# Patient Record
Sex: Female | Born: 1935
Health system: Southern US, Community
[De-identification: ages and names within clinical notes are randomized; demographics above are authoritative.]

## PROBLEM LIST (undated history)

## (undated) DIAGNOSIS — I2699 Other pulmonary embolism without acute cor pulmonale: Secondary | ICD-10-CM

## (undated) DIAGNOSIS — R319 Hematuria, unspecified: Secondary | ICD-10-CM

## (undated) DIAGNOSIS — E785 Hyperlipidemia, unspecified: Secondary | ICD-10-CM

## (undated) DIAGNOSIS — C189 Malignant neoplasm of colon, unspecified: Secondary | ICD-10-CM

## (undated) DIAGNOSIS — K8020 Calculus of gallbladder without cholecystitis without obstruction: Secondary | ICD-10-CM

## (undated) DIAGNOSIS — N281 Cyst of kidney, acquired: Secondary | ICD-10-CM

## (undated) DIAGNOSIS — J8410 Pulmonary fibrosis, unspecified: Secondary | ICD-10-CM

## (undated) DIAGNOSIS — M47816 Spondylosis without myelopathy or radiculopathy, lumbar region: Secondary | ICD-10-CM

## (undated) DIAGNOSIS — I639 Cerebral infarction, unspecified: Secondary | ICD-10-CM

## (undated) HISTORY — PX: COLON SURGERY: SHX602

## (undated) HISTORY — PX: OTHER SURGICAL HISTORY: SHX169

---

## 2006-02-08 ENCOUNTER — Ambulatory Visit: Payer: Self-pay | Admitting: Oncology

## 2006-06-04 ENCOUNTER — Ambulatory Visit: Payer: Self-pay | Admitting: Oncology

## 2006-09-24 ENCOUNTER — Ambulatory Visit: Payer: Self-pay | Admitting: Oncology

## 2007-01-14 ENCOUNTER — Ambulatory Visit: Payer: Self-pay | Admitting: Oncology

## 2007-05-13 ENCOUNTER — Ambulatory Visit: Payer: Self-pay | Admitting: Oncology

## 2016-09-17 DIAGNOSIS — I639 Cerebral infarction, unspecified: Secondary | ICD-10-CM

## 2016-09-17 HISTORY — DX: Cerebral infarction, unspecified: I63.9

## 2018-08-18 DIAGNOSIS — M160 Bilateral primary osteoarthritis of hip: Secondary | ICD-10-CM | POA: Diagnosis not present

## 2018-08-18 DIAGNOSIS — E782 Mixed hyperlipidemia: Secondary | ICD-10-CM | POA: Diagnosis not present

## 2018-08-18 DIAGNOSIS — I10 Essential (primary) hypertension: Secondary | ICD-10-CM | POA: Diagnosis not present

## 2018-09-08 DIAGNOSIS — D72829 Elevated white blood cell count, unspecified: Secondary | ICD-10-CM | POA: Diagnosis not present

## 2018-09-08 DIAGNOSIS — E876 Hypokalemia: Secondary | ICD-10-CM | POA: Diagnosis not present

## 2018-09-08 DIAGNOSIS — I2699 Other pulmonary embolism without acute cor pulmonale: Secondary | ICD-10-CM

## 2018-09-08 DIAGNOSIS — R0781 Pleurodynia: Secondary | ICD-10-CM | POA: Diagnosis not present

## 2018-09-08 DIAGNOSIS — K8020 Calculus of gallbladder without cholecystitis without obstruction: Secondary | ICD-10-CM | POA: Diagnosis not present

## 2018-09-08 DIAGNOSIS — R8290 Unspecified abnormal findings in urine: Secondary | ICD-10-CM | POA: Diagnosis not present

## 2018-09-08 DIAGNOSIS — R109 Unspecified abdominal pain: Secondary | ICD-10-CM | POA: Diagnosis not present

## 2018-09-08 DIAGNOSIS — Z79899 Other long term (current) drug therapy: Secondary | ICD-10-CM | POA: Diagnosis not present

## 2018-09-08 DIAGNOSIS — I10 Essential (primary) hypertension: Secondary | ICD-10-CM | POA: Diagnosis not present

## 2018-09-08 DIAGNOSIS — Z792 Long term (current) use of antibiotics: Secondary | ICD-10-CM | POA: Diagnosis not present

## 2018-09-08 DIAGNOSIS — Z7901 Long term (current) use of anticoagulants: Secondary | ICD-10-CM | POA: Diagnosis not present

## 2018-09-09 DIAGNOSIS — I340 Nonrheumatic mitral (valve) insufficiency: Secondary | ICD-10-CM

## 2018-09-09 DIAGNOSIS — I350 Nonrheumatic aortic (valve) stenosis: Secondary | ICD-10-CM

## 2018-09-13 DIAGNOSIS — E785 Hyperlipidemia, unspecified: Secondary | ICD-10-CM | POA: Diagnosis not present

## 2018-09-13 DIAGNOSIS — N390 Urinary tract infection, site not specified: Secondary | ICD-10-CM | POA: Diagnosis not present

## 2018-09-13 DIAGNOSIS — Z823 Family history of stroke: Secondary | ICD-10-CM | POA: Diagnosis not present

## 2018-09-13 DIAGNOSIS — M1990 Unspecified osteoarthritis, unspecified site: Secondary | ICD-10-CM | POA: Diagnosis not present

## 2018-09-13 DIAGNOSIS — I2699 Other pulmonary embolism without acute cor pulmonale: Secondary | ICD-10-CM

## 2018-09-13 DIAGNOSIS — I10 Essential (primary) hypertension: Secondary | ICD-10-CM | POA: Diagnosis not present

## 2018-09-13 DIAGNOSIS — R5381 Other malaise: Secondary | ICD-10-CM | POA: Diagnosis not present

## 2018-09-13 DIAGNOSIS — R0602 Shortness of breath: Secondary | ICD-10-CM | POA: Diagnosis not present

## 2018-09-13 DIAGNOSIS — J9601 Acute respiratory failure with hypoxia: Secondary | ICD-10-CM | POA: Diagnosis not present

## 2018-09-13 DIAGNOSIS — Z7901 Long term (current) use of anticoagulants: Secondary | ICD-10-CM | POA: Diagnosis not present

## 2018-09-13 DIAGNOSIS — Z85038 Personal history of other malignant neoplasm of large intestine: Secondary | ICD-10-CM | POA: Diagnosis not present

## 2018-09-13 DIAGNOSIS — Z8673 Personal history of transient ischemic attack (TIA), and cerebral infarction without residual deficits: Secondary | ICD-10-CM | POA: Diagnosis not present

## 2018-09-13 DIAGNOSIS — Z87891 Personal history of nicotine dependence: Secondary | ICD-10-CM | POA: Diagnosis not present

## 2018-09-13 DIAGNOSIS — Z86711 Personal history of pulmonary embolism: Secondary | ICD-10-CM | POA: Diagnosis not present

## 2018-09-13 DIAGNOSIS — R0902 Hypoxemia: Secondary | ICD-10-CM | POA: Diagnosis not present

## 2018-09-13 DIAGNOSIS — Z8719 Personal history of other diseases of the digestive system: Secondary | ICD-10-CM | POA: Diagnosis not present

## 2018-09-13 DIAGNOSIS — M79672 Pain in left foot: Secondary | ICD-10-CM | POA: Diagnosis not present

## 2018-09-13 DIAGNOSIS — I82811 Embolism and thrombosis of superficial veins of right lower extremities: Secondary | ICD-10-CM | POA: Diagnosis not present

## 2018-09-13 HISTORY — DX: Other pulmonary embolism without acute cor pulmonale: I26.99

## 2018-09-14 DIAGNOSIS — I083 Combined rheumatic disorders of mitral, aortic and tricuspid valves: Secondary | ICD-10-CM | POA: Diagnosis not present

## 2018-09-14 DIAGNOSIS — J9601 Acute respiratory failure with hypoxia: Secondary | ICD-10-CM | POA: Diagnosis not present

## 2018-09-14 DIAGNOSIS — I2699 Other pulmonary embolism without acute cor pulmonale: Secondary | ICD-10-CM | POA: Diagnosis not present

## 2018-09-14 DIAGNOSIS — R5381 Other malaise: Secondary | ICD-10-CM | POA: Diagnosis not present

## 2018-09-15 DIAGNOSIS — N3000 Acute cystitis without hematuria: Secondary | ICD-10-CM | POA: Diagnosis not present

## 2018-09-15 DIAGNOSIS — J9601 Acute respiratory failure with hypoxia: Secondary | ICD-10-CM | POA: Diagnosis not present

## 2018-09-15 DIAGNOSIS — R5381 Other malaise: Secondary | ICD-10-CM | POA: Diagnosis not present

## 2018-09-15 DIAGNOSIS — I2699 Other pulmonary embolism without acute cor pulmonale: Secondary | ICD-10-CM | POA: Diagnosis not present

## 2018-09-16 DIAGNOSIS — N3000 Acute cystitis without hematuria: Secondary | ICD-10-CM | POA: Diagnosis not present

## 2018-09-16 DIAGNOSIS — J9601 Acute respiratory failure with hypoxia: Secondary | ICD-10-CM | POA: Diagnosis not present

## 2018-09-16 DIAGNOSIS — R5381 Other malaise: Secondary | ICD-10-CM | POA: Diagnosis not present

## 2018-09-16 DIAGNOSIS — I2699 Other pulmonary embolism without acute cor pulmonale: Secondary | ICD-10-CM | POA: Diagnosis not present

## 2018-09-17 DIAGNOSIS — N390 Urinary tract infection, site not specified: Secondary | ICD-10-CM | POA: Diagnosis not present

## 2018-09-17 DIAGNOSIS — E785 Hyperlipidemia, unspecified: Secondary | ICD-10-CM | POA: Diagnosis not present

## 2018-09-17 DIAGNOSIS — R5381 Other malaise: Secondary | ICD-10-CM | POA: Diagnosis not present

## 2018-09-17 DIAGNOSIS — Z87891 Personal history of nicotine dependence: Secondary | ICD-10-CM | POA: Diagnosis not present

## 2018-09-17 DIAGNOSIS — I2699 Other pulmonary embolism without acute cor pulmonale: Secondary | ICD-10-CM | POA: Diagnosis not present

## 2018-09-17 DIAGNOSIS — I10 Essential (primary) hypertension: Secondary | ICD-10-CM | POA: Diagnosis not present

## 2018-09-17 DIAGNOSIS — N3000 Acute cystitis without hematuria: Secondary | ICD-10-CM | POA: Diagnosis not present

## 2018-09-17 DIAGNOSIS — J9601 Acute respiratory failure with hypoxia: Secondary | ICD-10-CM | POA: Diagnosis not present

## 2018-09-17 DIAGNOSIS — Z8719 Personal history of other diseases of the digestive system: Secondary | ICD-10-CM | POA: Diagnosis not present

## 2018-09-17 DIAGNOSIS — Z86711 Personal history of pulmonary embolism: Secondary | ICD-10-CM | POA: Diagnosis not present

## 2018-09-17 DIAGNOSIS — Z823 Family history of stroke: Secondary | ICD-10-CM | POA: Diagnosis not present

## 2018-09-17 DIAGNOSIS — M79672 Pain in left foot: Secondary | ICD-10-CM | POA: Diagnosis not present

## 2018-09-17 DIAGNOSIS — Z85038 Personal history of other malignant neoplasm of large intestine: Secondary | ICD-10-CM | POA: Diagnosis not present

## 2018-09-17 DIAGNOSIS — R600 Localized edema: Secondary | ICD-10-CM | POA: Diagnosis not present

## 2018-09-17 DIAGNOSIS — M1990 Unspecified osteoarthritis, unspecified site: Secondary | ICD-10-CM | POA: Diagnosis not present

## 2018-09-17 DIAGNOSIS — Z7901 Long term (current) use of anticoagulants: Secondary | ICD-10-CM | POA: Diagnosis not present

## 2018-09-17 DIAGNOSIS — Z8673 Personal history of transient ischemic attack (TIA), and cerebral infarction without residual deficits: Secondary | ICD-10-CM | POA: Diagnosis not present

## 2018-09-17 DIAGNOSIS — M19072 Primary osteoarthritis, left ankle and foot: Secondary | ICD-10-CM | POA: Diagnosis not present

## 2018-09-18 DIAGNOSIS — I482 Chronic atrial fibrillation, unspecified: Secondary | ICD-10-CM | POA: Diagnosis not present

## 2018-09-18 DIAGNOSIS — Z7901 Long term (current) use of anticoagulants: Secondary | ICD-10-CM | POA: Diagnosis not present

## 2018-09-18 DIAGNOSIS — Z8673 Personal history of transient ischemic attack (TIA), and cerebral infarction without residual deficits: Secondary | ICD-10-CM | POA: Diagnosis not present

## 2018-09-18 DIAGNOSIS — J9601 Acute respiratory failure with hypoxia: Secondary | ICD-10-CM | POA: Diagnosis not present

## 2018-09-18 DIAGNOSIS — M25572 Pain in left ankle and joints of left foot: Secondary | ICD-10-CM | POA: Diagnosis not present

## 2018-09-18 DIAGNOSIS — Z79899 Other long term (current) drug therapy: Secondary | ICD-10-CM | POA: Diagnosis not present

## 2018-09-18 DIAGNOSIS — E785 Hyperlipidemia, unspecified: Secondary | ICD-10-CM | POA: Diagnosis not present

## 2018-09-18 DIAGNOSIS — M79671 Pain in right foot: Secondary | ICD-10-CM | POA: Diagnosis not present

## 2018-09-18 DIAGNOSIS — R509 Fever, unspecified: Secondary | ICD-10-CM | POA: Diagnosis not present

## 2018-09-18 DIAGNOSIS — M79605 Pain in left leg: Secondary | ICD-10-CM | POA: Diagnosis not present

## 2018-09-18 DIAGNOSIS — R05 Cough: Secondary | ICD-10-CM | POA: Diagnosis not present

## 2018-09-18 DIAGNOSIS — M79672 Pain in left foot: Secondary | ICD-10-CM | POA: Diagnosis not present

## 2018-09-18 DIAGNOSIS — Z9189 Other specified personal risk factors, not elsewhere classified: Secondary | ICD-10-CM | POA: Diagnosis not present

## 2018-09-18 DIAGNOSIS — Z888 Allergy status to other drugs, medicaments and biological substances status: Secondary | ICD-10-CM | POA: Diagnosis not present

## 2018-09-18 DIAGNOSIS — R5381 Other malaise: Secondary | ICD-10-CM | POA: Diagnosis not present

## 2018-09-18 DIAGNOSIS — I2699 Other pulmonary embolism without acute cor pulmonale: Secondary | ICD-10-CM | POA: Diagnosis not present

## 2018-09-18 DIAGNOSIS — Z85038 Personal history of other malignant neoplasm of large intestine: Secondary | ICD-10-CM | POA: Diagnosis not present

## 2018-09-18 DIAGNOSIS — M1991 Primary osteoarthritis, unspecified site: Secondary | ICD-10-CM | POA: Diagnosis not present

## 2018-09-18 DIAGNOSIS — Z8719 Personal history of other diseases of the digestive system: Secondary | ICD-10-CM | POA: Diagnosis not present

## 2018-09-18 DIAGNOSIS — Z9981 Dependence on supplemental oxygen: Secondary | ICD-10-CM | POA: Diagnosis not present

## 2018-09-18 DIAGNOSIS — Z87891 Personal history of nicotine dependence: Secondary | ICD-10-CM | POA: Diagnosis not present

## 2018-09-18 DIAGNOSIS — N390 Urinary tract infection, site not specified: Secondary | ICD-10-CM | POA: Diagnosis not present

## 2018-09-18 DIAGNOSIS — R0902 Hypoxemia: Secondary | ICD-10-CM | POA: Diagnosis not present

## 2018-09-18 DIAGNOSIS — R319 Hematuria, unspecified: Secondary | ICD-10-CM | POA: Diagnosis not present

## 2018-09-18 DIAGNOSIS — Z86711 Personal history of pulmonary embolism: Secondary | ICD-10-CM | POA: Diagnosis not present

## 2018-09-18 DIAGNOSIS — I10 Essential (primary) hypertension: Secondary | ICD-10-CM | POA: Diagnosis not present

## 2018-09-18 DIAGNOSIS — N3000 Acute cystitis without hematuria: Secondary | ICD-10-CM | POA: Diagnosis not present

## 2018-09-18 DIAGNOSIS — M19072 Primary osteoarthritis, left ankle and foot: Secondary | ICD-10-CM | POA: Diagnosis not present

## 2018-09-18 DIAGNOSIS — M7989 Other specified soft tissue disorders: Secondary | ICD-10-CM | POA: Diagnosis not present

## 2018-09-18 DIAGNOSIS — M7732 Calcaneal spur, left foot: Secondary | ICD-10-CM | POA: Diagnosis not present

## 2018-09-18 DIAGNOSIS — M25472 Effusion, left ankle: Secondary | ICD-10-CM | POA: Diagnosis not present

## 2018-09-18 DIAGNOSIS — Z823 Family history of stroke: Secondary | ICD-10-CM | POA: Diagnosis not present

## 2018-09-18 DIAGNOSIS — M1990 Unspecified osteoarthritis, unspecified site: Secondary | ICD-10-CM | POA: Diagnosis not present

## 2018-09-18 DIAGNOSIS — I824Y1 Acute embolism and thrombosis of unspecified deep veins of right proximal lower extremity: Secondary | ICD-10-CM | POA: Diagnosis not present

## 2018-09-23 DIAGNOSIS — Z888 Allergy status to other drugs, medicaments and biological substances status: Secondary | ICD-10-CM | POA: Diagnosis not present

## 2018-09-23 DIAGNOSIS — M79605 Pain in left leg: Secondary | ICD-10-CM | POA: Diagnosis not present

## 2018-09-23 DIAGNOSIS — Z8673 Personal history of transient ischemic attack (TIA), and cerebral infarction without residual deficits: Secondary | ICD-10-CM | POA: Diagnosis not present

## 2018-09-23 DIAGNOSIS — Z86711 Personal history of pulmonary embolism: Secondary | ICD-10-CM | POA: Diagnosis not present

## 2018-09-23 DIAGNOSIS — I482 Chronic atrial fibrillation, unspecified: Secondary | ICD-10-CM | POA: Diagnosis not present

## 2018-09-23 DIAGNOSIS — M25572 Pain in left ankle and joints of left foot: Secondary | ICD-10-CM | POA: Diagnosis not present

## 2018-09-23 DIAGNOSIS — M25472 Effusion, left ankle: Secondary | ICD-10-CM | POA: Diagnosis not present

## 2018-09-23 DIAGNOSIS — M1991 Primary osteoarthritis, unspecified site: Secondary | ICD-10-CM | POA: Diagnosis not present

## 2018-09-23 DIAGNOSIS — Z7901 Long term (current) use of anticoagulants: Secondary | ICD-10-CM | POA: Diagnosis not present

## 2018-09-23 DIAGNOSIS — M79672 Pain in left foot: Secondary | ICD-10-CM | POA: Diagnosis not present

## 2018-09-23 DIAGNOSIS — M7989 Other specified soft tissue disorders: Secondary | ICD-10-CM | POA: Diagnosis not present

## 2018-09-23 DIAGNOSIS — Z79899 Other long term (current) drug therapy: Secondary | ICD-10-CM | POA: Diagnosis not present

## 2018-09-23 DIAGNOSIS — M7732 Calcaneal spur, left foot: Secondary | ICD-10-CM | POA: Diagnosis not present

## 2018-09-23 DIAGNOSIS — I10 Essential (primary) hypertension: Secondary | ICD-10-CM | POA: Diagnosis not present

## 2018-09-23 DIAGNOSIS — E785 Hyperlipidemia, unspecified: Secondary | ICD-10-CM | POA: Diagnosis not present

## 2018-09-23 DIAGNOSIS — M19072 Primary osteoarthritis, left ankle and foot: Secondary | ICD-10-CM | POA: Diagnosis not present

## 2018-09-25 DIAGNOSIS — J9601 Acute respiratory failure with hypoxia: Secondary | ICD-10-CM | POA: Diagnosis not present

## 2018-09-25 DIAGNOSIS — R5381 Other malaise: Secondary | ICD-10-CM | POA: Diagnosis not present

## 2018-09-25 DIAGNOSIS — I824Y1 Acute embolism and thrombosis of unspecified deep veins of right proximal lower extremity: Secondary | ICD-10-CM | POA: Diagnosis not present

## 2018-09-25 DIAGNOSIS — I2699 Other pulmonary embolism without acute cor pulmonale: Secondary | ICD-10-CM | POA: Diagnosis not present

## 2018-09-26 DIAGNOSIS — M1990 Unspecified osteoarthritis, unspecified site: Secondary | ICD-10-CM | POA: Diagnosis not present

## 2018-09-26 DIAGNOSIS — I482 Chronic atrial fibrillation, unspecified: Secondary | ICD-10-CM | POA: Diagnosis not present

## 2018-09-26 DIAGNOSIS — I10 Essential (primary) hypertension: Secondary | ICD-10-CM | POA: Diagnosis not present

## 2018-09-26 DIAGNOSIS — Z7901 Long term (current) use of anticoagulants: Secondary | ICD-10-CM | POA: Diagnosis not present

## 2018-09-29 DIAGNOSIS — Z7901 Long term (current) use of anticoagulants: Secondary | ICD-10-CM | POA: Diagnosis not present

## 2018-09-29 DIAGNOSIS — R319 Hematuria, unspecified: Secondary | ICD-10-CM | POA: Diagnosis not present

## 2018-09-29 DIAGNOSIS — I2699 Other pulmonary embolism without acute cor pulmonale: Secondary | ICD-10-CM | POA: Diagnosis not present

## 2018-09-29 DIAGNOSIS — I482 Chronic atrial fibrillation, unspecified: Secondary | ICD-10-CM | POA: Diagnosis not present

## 2018-09-30 DIAGNOSIS — M79671 Pain in right foot: Secondary | ICD-10-CM | POA: Diagnosis not present

## 2018-09-30 DIAGNOSIS — I482 Chronic atrial fibrillation, unspecified: Secondary | ICD-10-CM | POA: Diagnosis not present

## 2018-09-30 DIAGNOSIS — I10 Essential (primary) hypertension: Secondary | ICD-10-CM | POA: Diagnosis not present

## 2018-09-30 DIAGNOSIS — R319 Hematuria, unspecified: Secondary | ICD-10-CM | POA: Diagnosis not present

## 2018-10-01 DIAGNOSIS — Z7901 Long term (current) use of anticoagulants: Secondary | ICD-10-CM | POA: Diagnosis not present

## 2018-10-01 DIAGNOSIS — I482 Chronic atrial fibrillation, unspecified: Secondary | ICD-10-CM | POA: Diagnosis not present

## 2018-10-01 DIAGNOSIS — Z9189 Other specified personal risk factors, not elsewhere classified: Secondary | ICD-10-CM | POA: Diagnosis not present

## 2018-10-01 DIAGNOSIS — R319 Hematuria, unspecified: Secondary | ICD-10-CM | POA: Diagnosis not present

## 2018-10-03 DIAGNOSIS — I824Y1 Acute embolism and thrombosis of unspecified deep veins of right proximal lower extremity: Secondary | ICD-10-CM | POA: Diagnosis not present

## 2018-10-03 DIAGNOSIS — I10 Essential (primary) hypertension: Secondary | ICD-10-CM | POA: Diagnosis not present

## 2018-10-03 DIAGNOSIS — I482 Chronic atrial fibrillation, unspecified: Secondary | ICD-10-CM | POA: Diagnosis not present

## 2018-10-03 DIAGNOSIS — Z7901 Long term (current) use of anticoagulants: Secondary | ICD-10-CM | POA: Diagnosis not present

## 2018-10-10 DIAGNOSIS — Z87891 Personal history of nicotine dependence: Secondary | ICD-10-CM | POA: Diagnosis not present

## 2018-10-10 DIAGNOSIS — Z79899 Other long term (current) drug therapy: Secondary | ICD-10-CM | POA: Diagnosis not present

## 2018-10-10 DIAGNOSIS — Z888 Allergy status to other drugs, medicaments and biological substances status: Secondary | ICD-10-CM | POA: Diagnosis not present

## 2018-10-10 DIAGNOSIS — E785 Hyperlipidemia, unspecified: Secondary | ICD-10-CM | POA: Diagnosis not present

## 2018-10-10 DIAGNOSIS — M1991 Primary osteoarthritis, unspecified site: Secondary | ICD-10-CM | POA: Diagnosis not present

## 2018-10-10 DIAGNOSIS — Z8673 Personal history of transient ischemic attack (TIA), and cerebral infarction without residual deficits: Secondary | ICD-10-CM | POA: Diagnosis not present

## 2018-10-10 DIAGNOSIS — Z9181 History of falling: Secondary | ICD-10-CM | POA: Diagnosis not present

## 2018-10-10 DIAGNOSIS — I10 Essential (primary) hypertension: Secondary | ICD-10-CM | POA: Diagnosis not present

## 2018-10-10 DIAGNOSIS — Z7901 Long term (current) use of anticoagulants: Secondary | ICD-10-CM | POA: Diagnosis not present

## 2018-10-10 DIAGNOSIS — R05 Cough: Secondary | ICD-10-CM | POA: Diagnosis not present

## 2018-10-10 DIAGNOSIS — Z87440 Personal history of urinary (tract) infections: Secondary | ICD-10-CM | POA: Diagnosis not present

## 2018-10-10 DIAGNOSIS — Z85038 Personal history of other malignant neoplasm of large intestine: Secondary | ICD-10-CM | POA: Diagnosis not present

## 2018-10-10 DIAGNOSIS — R0689 Other abnormalities of breathing: Secondary | ICD-10-CM | POA: Diagnosis not present

## 2018-10-10 DIAGNOSIS — I2699 Other pulmonary embolism without acute cor pulmonale: Secondary | ICD-10-CM | POA: Diagnosis not present

## 2018-10-10 DIAGNOSIS — R0600 Dyspnea, unspecified: Secondary | ICD-10-CM | POA: Diagnosis not present

## 2018-10-10 DIAGNOSIS — R509 Fever, unspecified: Secondary | ICD-10-CM | POA: Diagnosis not present

## 2018-10-10 DIAGNOSIS — J449 Chronic obstructive pulmonary disease, unspecified: Secondary | ICD-10-CM | POA: Diagnosis not present

## 2018-10-10 DIAGNOSIS — J069 Acute upper respiratory infection, unspecified: Secondary | ICD-10-CM | POA: Diagnosis not present

## 2018-10-10 DIAGNOSIS — Z86711 Personal history of pulmonary embolism: Secondary | ICD-10-CM | POA: Diagnosis not present

## 2018-10-10 DIAGNOSIS — J849 Interstitial pulmonary disease, unspecified: Secondary | ICD-10-CM | POA: Diagnosis not present

## 2018-10-13 DIAGNOSIS — Z87891 Personal history of nicotine dependence: Secondary | ICD-10-CM | POA: Diagnosis not present

## 2018-10-13 DIAGNOSIS — Z85038 Personal history of other malignant neoplasm of large intestine: Secondary | ICD-10-CM | POA: Diagnosis not present

## 2018-10-13 DIAGNOSIS — I2699 Other pulmonary embolism without acute cor pulmonale: Secondary | ICD-10-CM | POA: Diagnosis not present

## 2018-10-13 DIAGNOSIS — Z87440 Personal history of urinary (tract) infections: Secondary | ICD-10-CM | POA: Diagnosis not present

## 2018-10-13 DIAGNOSIS — M1991 Primary osteoarthritis, unspecified site: Secondary | ICD-10-CM | POA: Diagnosis not present

## 2018-10-13 DIAGNOSIS — Z7901 Long term (current) use of anticoagulants: Secondary | ICD-10-CM | POA: Diagnosis not present

## 2018-10-13 DIAGNOSIS — Z8673 Personal history of transient ischemic attack (TIA), and cerebral infarction without residual deficits: Secondary | ICD-10-CM | POA: Diagnosis not present

## 2018-10-13 DIAGNOSIS — Z9181 History of falling: Secondary | ICD-10-CM | POA: Diagnosis not present

## 2018-10-13 DIAGNOSIS — I10 Essential (primary) hypertension: Secondary | ICD-10-CM | POA: Diagnosis not present

## 2018-10-13 DIAGNOSIS — E785 Hyperlipidemia, unspecified: Secondary | ICD-10-CM | POA: Diagnosis not present

## 2018-10-21 DIAGNOSIS — R55 Syncope and collapse: Secondary | ICD-10-CM | POA: Diagnosis not present

## 2018-10-21 DIAGNOSIS — Z8673 Personal history of transient ischemic attack (TIA), and cerebral infarction without residual deficits: Secondary | ICD-10-CM | POA: Diagnosis not present

## 2018-10-21 DIAGNOSIS — E785 Hyperlipidemia, unspecified: Secondary | ICD-10-CM | POA: Diagnosis not present

## 2018-10-21 DIAGNOSIS — Z85038 Personal history of other malignant neoplasm of large intestine: Secondary | ICD-10-CM | POA: Diagnosis not present

## 2018-10-21 DIAGNOSIS — I10 Essential (primary) hypertension: Secondary | ICD-10-CM | POA: Diagnosis not present

## 2018-10-21 DIAGNOSIS — Z9181 History of falling: Secondary | ICD-10-CM | POA: Diagnosis not present

## 2018-10-21 DIAGNOSIS — Z7901 Long term (current) use of anticoagulants: Secondary | ICD-10-CM | POA: Diagnosis not present

## 2018-10-21 DIAGNOSIS — I2699 Other pulmonary embolism without acute cor pulmonale: Secondary | ICD-10-CM | POA: Diagnosis not present

## 2018-10-21 DIAGNOSIS — Z87891 Personal history of nicotine dependence: Secondary | ICD-10-CM | POA: Diagnosis not present

## 2018-10-21 DIAGNOSIS — Z87440 Personal history of urinary (tract) infections: Secondary | ICD-10-CM | POA: Diagnosis not present

## 2018-10-21 DIAGNOSIS — M1991 Primary osteoarthritis, unspecified site: Secondary | ICD-10-CM | POA: Diagnosis not present

## 2018-10-22 DIAGNOSIS — Z87891 Personal history of nicotine dependence: Secondary | ICD-10-CM | POA: Diagnosis not present

## 2018-10-22 DIAGNOSIS — Z8673 Personal history of transient ischemic attack (TIA), and cerebral infarction without residual deficits: Secondary | ICD-10-CM | POA: Diagnosis not present

## 2018-10-22 DIAGNOSIS — E785 Hyperlipidemia, unspecified: Secondary | ICD-10-CM | POA: Diagnosis not present

## 2018-10-22 DIAGNOSIS — I10 Essential (primary) hypertension: Secondary | ICD-10-CM | POA: Diagnosis not present

## 2018-10-22 DIAGNOSIS — Z7901 Long term (current) use of anticoagulants: Secondary | ICD-10-CM | POA: Diagnosis not present

## 2018-10-22 DIAGNOSIS — Z9181 History of falling: Secondary | ICD-10-CM | POA: Diagnosis not present

## 2018-10-22 DIAGNOSIS — Z85038 Personal history of other malignant neoplasm of large intestine: Secondary | ICD-10-CM | POA: Diagnosis not present

## 2018-10-22 DIAGNOSIS — M1991 Primary osteoarthritis, unspecified site: Secondary | ICD-10-CM | POA: Diagnosis not present

## 2018-10-22 DIAGNOSIS — I2699 Other pulmonary embolism without acute cor pulmonale: Secondary | ICD-10-CM | POA: Diagnosis not present

## 2018-10-22 DIAGNOSIS — Z87440 Personal history of urinary (tract) infections: Secondary | ICD-10-CM | POA: Diagnosis not present

## 2018-10-22 DIAGNOSIS — R55 Syncope and collapse: Secondary | ICD-10-CM | POA: Diagnosis not present

## 2018-10-24 DIAGNOSIS — M1991 Primary osteoarthritis, unspecified site: Secondary | ICD-10-CM | POA: Diagnosis not present

## 2018-10-24 DIAGNOSIS — R55 Syncope and collapse: Secondary | ICD-10-CM | POA: Diagnosis not present

## 2018-10-24 DIAGNOSIS — E785 Hyperlipidemia, unspecified: Secondary | ICD-10-CM | POA: Diagnosis not present

## 2018-10-24 DIAGNOSIS — Z87891 Personal history of nicotine dependence: Secondary | ICD-10-CM | POA: Diagnosis not present

## 2018-10-24 DIAGNOSIS — I10 Essential (primary) hypertension: Secondary | ICD-10-CM | POA: Diagnosis not present

## 2018-10-24 DIAGNOSIS — Z85038 Personal history of other malignant neoplasm of large intestine: Secondary | ICD-10-CM | POA: Diagnosis not present

## 2018-10-24 DIAGNOSIS — Z9181 History of falling: Secondary | ICD-10-CM | POA: Diagnosis not present

## 2018-10-24 DIAGNOSIS — Z87440 Personal history of urinary (tract) infections: Secondary | ICD-10-CM | POA: Diagnosis not present

## 2018-10-24 DIAGNOSIS — Z7901 Long term (current) use of anticoagulants: Secondary | ICD-10-CM | POA: Diagnosis not present

## 2018-10-24 DIAGNOSIS — Z8673 Personal history of transient ischemic attack (TIA), and cerebral infarction without residual deficits: Secondary | ICD-10-CM | POA: Diagnosis not present

## 2018-10-24 DIAGNOSIS — I2699 Other pulmonary embolism without acute cor pulmonale: Secondary | ICD-10-CM | POA: Diagnosis not present

## 2018-10-27 DIAGNOSIS — M1991 Primary osteoarthritis, unspecified site: Secondary | ICD-10-CM | POA: Diagnosis not present

## 2018-10-27 DIAGNOSIS — Z85038 Personal history of other malignant neoplasm of large intestine: Secondary | ICD-10-CM | POA: Diagnosis not present

## 2018-10-27 DIAGNOSIS — Z87440 Personal history of urinary (tract) infections: Secondary | ICD-10-CM | POA: Diagnosis not present

## 2018-10-27 DIAGNOSIS — Z7901 Long term (current) use of anticoagulants: Secondary | ICD-10-CM | POA: Diagnosis not present

## 2018-10-27 DIAGNOSIS — Z8673 Personal history of transient ischemic attack (TIA), and cerebral infarction without residual deficits: Secondary | ICD-10-CM | POA: Diagnosis not present

## 2018-10-27 DIAGNOSIS — Z9181 History of falling: Secondary | ICD-10-CM | POA: Diagnosis not present

## 2018-10-27 DIAGNOSIS — I2699 Other pulmonary embolism without acute cor pulmonale: Secondary | ICD-10-CM | POA: Diagnosis not present

## 2018-10-27 DIAGNOSIS — Z87891 Personal history of nicotine dependence: Secondary | ICD-10-CM | POA: Diagnosis not present

## 2018-10-27 DIAGNOSIS — I10 Essential (primary) hypertension: Secondary | ICD-10-CM | POA: Diagnosis not present

## 2018-10-27 DIAGNOSIS — E785 Hyperlipidemia, unspecified: Secondary | ICD-10-CM | POA: Diagnosis not present

## 2018-10-28 DIAGNOSIS — B9689 Other specified bacterial agents as the cause of diseases classified elsewhere: Secondary | ICD-10-CM | POA: Diagnosis not present

## 2018-10-28 DIAGNOSIS — S0011XA Contusion of right eyelid and periocular area, initial encounter: Secondary | ICD-10-CM | POA: Diagnosis not present

## 2018-10-28 DIAGNOSIS — Z9989 Dependence on other enabling machines and devices: Secondary | ICD-10-CM | POA: Diagnosis not present

## 2018-10-28 DIAGNOSIS — I2699 Other pulmonary embolism without acute cor pulmonale: Secondary | ICD-10-CM | POA: Diagnosis not present

## 2018-10-28 DIAGNOSIS — I6523 Occlusion and stenosis of bilateral carotid arteries: Secondary | ICD-10-CM | POA: Diagnosis not present

## 2018-10-28 DIAGNOSIS — Z79899 Other long term (current) drug therapy: Secondary | ICD-10-CM | POA: Diagnosis not present

## 2018-10-28 DIAGNOSIS — Z7901 Long term (current) use of anticoagulants: Secondary | ICD-10-CM | POA: Diagnosis not present

## 2018-10-28 DIAGNOSIS — R531 Weakness: Secondary | ICD-10-CM | POA: Diagnosis not present

## 2018-10-28 DIAGNOSIS — J439 Emphysema, unspecified: Secondary | ICD-10-CM | POA: Diagnosis not present

## 2018-10-28 DIAGNOSIS — I959 Hypotension, unspecified: Secondary | ICD-10-CM | POA: Diagnosis not present

## 2018-10-28 DIAGNOSIS — S0990XA Unspecified injury of head, initial encounter: Secondary | ICD-10-CM | POA: Diagnosis not present

## 2018-10-28 DIAGNOSIS — N390 Urinary tract infection, site not specified: Secondary | ICD-10-CM | POA: Diagnosis not present

## 2018-10-28 DIAGNOSIS — R319 Hematuria, unspecified: Secondary | ICD-10-CM | POA: Diagnosis not present

## 2018-10-28 DIAGNOSIS — M4802 Spinal stenosis, cervical region: Secondary | ICD-10-CM | POA: Diagnosis not present

## 2018-10-28 DIAGNOSIS — R55 Syncope and collapse: Secondary | ICD-10-CM | POA: Diagnosis not present

## 2018-10-28 DIAGNOSIS — S0083XA Contusion of other part of head, initial encounter: Secondary | ICD-10-CM | POA: Diagnosis not present

## 2018-10-28 DIAGNOSIS — S199XXA Unspecified injury of neck, initial encounter: Secondary | ICD-10-CM | POA: Diagnosis not present

## 2018-10-28 DIAGNOSIS — Z7409 Other reduced mobility: Secondary | ICD-10-CM | POA: Diagnosis not present

## 2018-10-28 DIAGNOSIS — R110 Nausea: Secondary | ICD-10-CM | POA: Diagnosis not present

## 2018-10-28 DIAGNOSIS — I440 Atrioventricular block, first degree: Secondary | ICD-10-CM | POA: Diagnosis not present

## 2018-10-28 DIAGNOSIS — Z8673 Personal history of transient ischemic attack (TIA), and cerebral infarction without residual deficits: Secondary | ICD-10-CM | POA: Diagnosis not present

## 2018-10-28 DIAGNOSIS — S129XXA Fracture of neck, unspecified, initial encounter: Secondary | ICD-10-CM | POA: Diagnosis not present

## 2018-10-28 DIAGNOSIS — I951 Orthostatic hypotension: Secondary | ICD-10-CM | POA: Diagnosis not present

## 2018-10-28 DIAGNOSIS — R197 Diarrhea, unspecified: Secondary | ICD-10-CM | POA: Diagnosis not present

## 2018-10-28 DIAGNOSIS — J3489 Other specified disorders of nose and nasal sinuses: Secondary | ICD-10-CM | POA: Diagnosis not present

## 2018-10-28 DIAGNOSIS — S12200A Unspecified displaced fracture of third cervical vertebra, initial encounter for closed fracture: Secondary | ICD-10-CM | POA: Diagnosis not present

## 2018-10-28 DIAGNOSIS — N3000 Acute cystitis without hematuria: Secondary | ICD-10-CM | POA: Diagnosis not present

## 2018-10-28 DIAGNOSIS — Z602 Problems related to living alone: Secondary | ICD-10-CM | POA: Diagnosis not present

## 2018-10-28 DIAGNOSIS — R112 Nausea with vomiting, unspecified: Secondary | ICD-10-CM | POA: Diagnosis not present

## 2018-10-28 DIAGNOSIS — S12290A Other displaced fracture of third cervical vertebra, initial encounter for closed fracture: Secondary | ICD-10-CM | POA: Diagnosis not present

## 2018-10-28 DIAGNOSIS — I10 Essential (primary) hypertension: Secondary | ICD-10-CM | POA: Diagnosis not present

## 2018-10-29 DIAGNOSIS — N3000 Acute cystitis without hematuria: Secondary | ICD-10-CM | POA: Diagnosis not present

## 2018-10-29 DIAGNOSIS — I10 Essential (primary) hypertension: Secondary | ICD-10-CM | POA: Diagnosis not present

## 2018-10-29 DIAGNOSIS — S12200A Unspecified displaced fracture of third cervical vertebra, initial encounter for closed fracture: Secondary | ICD-10-CM | POA: Diagnosis not present

## 2018-10-29 DIAGNOSIS — R55 Syncope and collapse: Secondary | ICD-10-CM | POA: Diagnosis not present

## 2018-10-29 DIAGNOSIS — I2119 ST elevation (STEMI) myocardial infarction involving other coronary artery of inferior wall: Secondary | ICD-10-CM | POA: Diagnosis not present

## 2018-10-29 DIAGNOSIS — I493 Ventricular premature depolarization: Secondary | ICD-10-CM | POA: Diagnosis not present

## 2018-10-29 DIAGNOSIS — I639 Cerebral infarction, unspecified: Secondary | ICD-10-CM | POA: Diagnosis not present

## 2018-10-30 DIAGNOSIS — R55 Syncope and collapse: Secondary | ICD-10-CM | POA: Diagnosis not present

## 2018-10-30 DIAGNOSIS — S12200D Unspecified displaced fracture of third cervical vertebra, subsequent encounter for fracture with routine healing: Secondary | ICD-10-CM | POA: Diagnosis not present

## 2018-10-30 DIAGNOSIS — I2699 Other pulmonary embolism without acute cor pulmonale: Secondary | ICD-10-CM | POA: Diagnosis not present

## 2018-10-30 DIAGNOSIS — R531 Weakness: Secondary | ICD-10-CM | POA: Diagnosis not present

## 2018-10-30 DIAGNOSIS — N3001 Acute cystitis with hematuria: Secondary | ICD-10-CM | POA: Diagnosis not present

## 2018-10-31 DIAGNOSIS — S12200D Unspecified displaced fracture of third cervical vertebra, subsequent encounter for fracture with routine healing: Secondary | ICD-10-CM | POA: Diagnosis not present

## 2018-10-31 DIAGNOSIS — R55 Syncope and collapse: Secondary | ICD-10-CM | POA: Diagnosis not present

## 2018-10-31 DIAGNOSIS — N3001 Acute cystitis with hematuria: Secondary | ICD-10-CM | POA: Diagnosis not present

## 2018-10-31 DIAGNOSIS — I2699 Other pulmonary embolism without acute cor pulmonale: Secondary | ICD-10-CM | POA: Diagnosis not present

## 2018-10-31 DIAGNOSIS — R531 Weakness: Secondary | ICD-10-CM | POA: Diagnosis not present

## 2018-11-03 DIAGNOSIS — Z87440 Personal history of urinary (tract) infections: Secondary | ICD-10-CM | POA: Diagnosis not present

## 2018-11-03 DIAGNOSIS — Z9181 History of falling: Secondary | ICD-10-CM | POA: Diagnosis not present

## 2018-11-03 DIAGNOSIS — M1991 Primary osteoarthritis, unspecified site: Secondary | ICD-10-CM | POA: Diagnosis not present

## 2018-11-03 DIAGNOSIS — Z87891 Personal history of nicotine dependence: Secondary | ICD-10-CM | POA: Diagnosis not present

## 2018-11-03 DIAGNOSIS — Z8673 Personal history of transient ischemic attack (TIA), and cerebral infarction without residual deficits: Secondary | ICD-10-CM | POA: Diagnosis not present

## 2018-11-03 DIAGNOSIS — I10 Essential (primary) hypertension: Secondary | ICD-10-CM | POA: Diagnosis not present

## 2018-11-03 DIAGNOSIS — E785 Hyperlipidemia, unspecified: Secondary | ICD-10-CM | POA: Diagnosis not present

## 2018-11-03 DIAGNOSIS — I2699 Other pulmonary embolism without acute cor pulmonale: Secondary | ICD-10-CM | POA: Diagnosis not present

## 2018-11-03 DIAGNOSIS — Z85038 Personal history of other malignant neoplasm of large intestine: Secondary | ICD-10-CM | POA: Diagnosis not present

## 2018-11-03 DIAGNOSIS — Z7901 Long term (current) use of anticoagulants: Secondary | ICD-10-CM | POA: Diagnosis not present

## 2018-11-04 DIAGNOSIS — I2699 Other pulmonary embolism without acute cor pulmonale: Secondary | ICD-10-CM | POA: Diagnosis not present

## 2018-11-04 DIAGNOSIS — Z87440 Personal history of urinary (tract) infections: Secondary | ICD-10-CM | POA: Diagnosis not present

## 2018-11-04 DIAGNOSIS — Z9181 History of falling: Secondary | ICD-10-CM | POA: Diagnosis not present

## 2018-11-04 DIAGNOSIS — Z87891 Personal history of nicotine dependence: Secondary | ICD-10-CM | POA: Diagnosis not present

## 2018-11-04 DIAGNOSIS — M1991 Primary osteoarthritis, unspecified site: Secondary | ICD-10-CM | POA: Diagnosis not present

## 2018-11-04 DIAGNOSIS — Z8673 Personal history of transient ischemic attack (TIA), and cerebral infarction without residual deficits: Secondary | ICD-10-CM | POA: Diagnosis not present

## 2018-11-04 DIAGNOSIS — Z7901 Long term (current) use of anticoagulants: Secondary | ICD-10-CM | POA: Diagnosis not present

## 2018-11-04 DIAGNOSIS — E785 Hyperlipidemia, unspecified: Secondary | ICD-10-CM | POA: Diagnosis not present

## 2018-11-04 DIAGNOSIS — I10 Essential (primary) hypertension: Secondary | ICD-10-CM | POA: Diagnosis not present

## 2018-11-04 DIAGNOSIS — Z85038 Personal history of other malignant neoplasm of large intestine: Secondary | ICD-10-CM | POA: Diagnosis not present

## 2018-11-05 DIAGNOSIS — I10 Essential (primary) hypertension: Secondary | ICD-10-CM | POA: Diagnosis not present

## 2018-11-05 DIAGNOSIS — M160 Bilateral primary osteoarthritis of hip: Secondary | ICD-10-CM | POA: Diagnosis not present

## 2018-11-05 DIAGNOSIS — E782 Mixed hyperlipidemia: Secondary | ICD-10-CM | POA: Diagnosis not present

## 2018-11-07 DIAGNOSIS — Z87440 Personal history of urinary (tract) infections: Secondary | ICD-10-CM | POA: Diagnosis not present

## 2018-11-07 DIAGNOSIS — E785 Hyperlipidemia, unspecified: Secondary | ICD-10-CM | POA: Diagnosis not present

## 2018-11-07 DIAGNOSIS — M1991 Primary osteoarthritis, unspecified site: Secondary | ICD-10-CM | POA: Diagnosis not present

## 2018-11-07 DIAGNOSIS — I2699 Other pulmonary embolism without acute cor pulmonale: Secondary | ICD-10-CM | POA: Diagnosis not present

## 2018-11-07 DIAGNOSIS — Z7901 Long term (current) use of anticoagulants: Secondary | ICD-10-CM | POA: Diagnosis not present

## 2018-11-07 DIAGNOSIS — Z87891 Personal history of nicotine dependence: Secondary | ICD-10-CM | POA: Diagnosis not present

## 2018-11-07 DIAGNOSIS — Z8673 Personal history of transient ischemic attack (TIA), and cerebral infarction without residual deficits: Secondary | ICD-10-CM | POA: Diagnosis not present

## 2018-11-07 DIAGNOSIS — Z85038 Personal history of other malignant neoplasm of large intestine: Secondary | ICD-10-CM | POA: Diagnosis not present

## 2018-11-07 DIAGNOSIS — I10 Essential (primary) hypertension: Secondary | ICD-10-CM | POA: Diagnosis not present

## 2018-11-07 DIAGNOSIS — Z9181 History of falling: Secondary | ICD-10-CM | POA: Diagnosis not present

## 2018-11-10 DIAGNOSIS — R509 Fever, unspecified: Secondary | ICD-10-CM | POA: Diagnosis not present

## 2018-11-10 DIAGNOSIS — R062 Wheezing: Secondary | ICD-10-CM | POA: Diagnosis not present

## 2018-11-10 DIAGNOSIS — M545 Low back pain: Secondary | ICD-10-CM | POA: Diagnosis not present

## 2018-11-10 DIAGNOSIS — R05 Cough: Secondary | ICD-10-CM | POA: Diagnosis not present

## 2018-11-11 DIAGNOSIS — Z8673 Personal history of transient ischemic attack (TIA), and cerebral infarction without residual deficits: Secondary | ICD-10-CM | POA: Diagnosis not present

## 2018-11-11 DIAGNOSIS — I10 Essential (primary) hypertension: Secondary | ICD-10-CM | POA: Diagnosis not present

## 2018-11-11 DIAGNOSIS — M1991 Primary osteoarthritis, unspecified site: Secondary | ICD-10-CM | POA: Diagnosis not present

## 2018-11-11 DIAGNOSIS — Z85038 Personal history of other malignant neoplasm of large intestine: Secondary | ICD-10-CM | POA: Diagnosis not present

## 2018-11-11 DIAGNOSIS — Z7901 Long term (current) use of anticoagulants: Secondary | ICD-10-CM | POA: Diagnosis not present

## 2018-11-11 DIAGNOSIS — Z87440 Personal history of urinary (tract) infections: Secondary | ICD-10-CM | POA: Diagnosis not present

## 2018-11-11 DIAGNOSIS — I2699 Other pulmonary embolism without acute cor pulmonale: Secondary | ICD-10-CM | POA: Diagnosis not present

## 2018-11-11 DIAGNOSIS — Z9181 History of falling: Secondary | ICD-10-CM | POA: Diagnosis not present

## 2018-11-11 DIAGNOSIS — Z87891 Personal history of nicotine dependence: Secondary | ICD-10-CM | POA: Diagnosis not present

## 2018-11-11 DIAGNOSIS — E785 Hyperlipidemia, unspecified: Secondary | ICD-10-CM | POA: Diagnosis not present

## 2018-11-14 DIAGNOSIS — Z85038 Personal history of other malignant neoplasm of large intestine: Secondary | ICD-10-CM | POA: Diagnosis not present

## 2018-11-14 DIAGNOSIS — R410 Disorientation, unspecified: Secondary | ICD-10-CM | POA: Diagnosis not present

## 2018-11-14 DIAGNOSIS — K5900 Constipation, unspecified: Secondary | ICD-10-CM | POA: Diagnosis not present

## 2018-11-14 DIAGNOSIS — Z87891 Personal history of nicotine dependence: Secondary | ICD-10-CM | POA: Diagnosis not present

## 2018-11-14 DIAGNOSIS — Z8673 Personal history of transient ischemic attack (TIA), and cerebral infarction without residual deficits: Secondary | ICD-10-CM | POA: Diagnosis not present

## 2018-11-14 DIAGNOSIS — I2699 Other pulmonary embolism without acute cor pulmonale: Secondary | ICD-10-CM | POA: Diagnosis not present

## 2018-11-14 DIAGNOSIS — Z87440 Personal history of urinary (tract) infections: Secondary | ICD-10-CM | POA: Diagnosis not present

## 2018-11-14 DIAGNOSIS — M1991 Primary osteoarthritis, unspecified site: Secondary | ICD-10-CM | POA: Diagnosis not present

## 2018-11-14 DIAGNOSIS — R8290 Unspecified abnormal findings in urine: Secondary | ICD-10-CM | POA: Diagnosis not present

## 2018-11-14 DIAGNOSIS — E785 Hyperlipidemia, unspecified: Secondary | ICD-10-CM | POA: Diagnosis not present

## 2018-11-14 DIAGNOSIS — Z7901 Long term (current) use of anticoagulants: Secondary | ICD-10-CM | POA: Diagnosis not present

## 2018-11-14 DIAGNOSIS — I10 Essential (primary) hypertension: Secondary | ICD-10-CM | POA: Diagnosis not present

## 2018-11-14 DIAGNOSIS — R791 Abnormal coagulation profile: Secondary | ICD-10-CM | POA: Diagnosis not present

## 2018-11-14 DIAGNOSIS — R52 Pain, unspecified: Secondary | ICD-10-CM | POA: Diagnosis not present

## 2018-11-14 DIAGNOSIS — Z9181 History of falling: Secondary | ICD-10-CM | POA: Diagnosis not present

## 2018-12-12 ENCOUNTER — Inpatient Hospital Stay (HOSPITAL_COMMUNITY)
Admission: AD | Admit: 2018-12-12 | Discharge: 2018-12-30 | DRG: 100 | Disposition: A | Discharge status: Skilled Nursing Facility | Payer: Medicare Other | Source: Other Acute Inpatient Hospital | Attending: Internal Medicine | Admitting: Internal Medicine

## 2018-12-12 ENCOUNTER — Inpatient Hospital Stay (HOSPITAL_COMMUNITY): Payer: Medicare Other

## 2018-12-12 DIAGNOSIS — I82619 Acute embolism and thrombosis of superficial veins of unspecified upper extremity: Secondary | ICD-10-CM | POA: Diagnosis not present

## 2018-12-12 DIAGNOSIS — R9401 Abnormal electroencephalogram [EEG]: Secondary | ICD-10-CM | POA: Diagnosis present

## 2018-12-12 DIAGNOSIS — G9341 Metabolic encephalopathy: Secondary | ICD-10-CM | POA: Diagnosis not present

## 2018-12-12 DIAGNOSIS — G9340 Encephalopathy, unspecified: Secondary | ICD-10-CM

## 2018-12-12 DIAGNOSIS — Z8673 Personal history of transient ischemic attack (TIA), and cerebral infarction without residual deficits: Secondary | ICD-10-CM

## 2018-12-12 DIAGNOSIS — M7989 Other specified soft tissue disorders: Secondary | ICD-10-CM | POA: Diagnosis not present

## 2018-12-12 DIAGNOSIS — R4702 Dysphasia: Secondary | ICD-10-CM | POA: Diagnosis not present

## 2018-12-12 DIAGNOSIS — J9811 Atelectasis: Secondary | ICD-10-CM | POA: Diagnosis not present

## 2018-12-12 DIAGNOSIS — E669 Obesity, unspecified: Secondary | ICD-10-CM | POA: Diagnosis present

## 2018-12-12 DIAGNOSIS — J8410 Pulmonary fibrosis, unspecified: Secondary | ICD-10-CM | POA: Diagnosis not present

## 2018-12-12 DIAGNOSIS — E876 Hypokalemia: Secondary | ICD-10-CM | POA: Diagnosis not present

## 2018-12-12 DIAGNOSIS — Z515 Encounter for palliative care: Secondary | ICD-10-CM | POA: Diagnosis not present

## 2018-12-12 DIAGNOSIS — R1310 Dysphagia, unspecified: Secondary | ICD-10-CM | POA: Diagnosis not present

## 2018-12-12 DIAGNOSIS — F0390 Unspecified dementia without behavioral disturbance: Secondary | ICD-10-CM | POA: Diagnosis present

## 2018-12-12 DIAGNOSIS — Z7401 Bed confinement status: Secondary | ICD-10-CM

## 2018-12-12 DIAGNOSIS — J9601 Acute respiratory failure with hypoxia: Secondary | ICD-10-CM

## 2018-12-12 DIAGNOSIS — H518 Other specified disorders of binocular movement: Secondary | ICD-10-CM | POA: Diagnosis not present

## 2018-12-12 DIAGNOSIS — Z8249 Family history of ischemic heart disease and other diseases of the circulatory system: Secondary | ICD-10-CM

## 2018-12-12 DIAGNOSIS — Z978 Presence of other specified devices: Secondary | ICD-10-CM

## 2018-12-12 DIAGNOSIS — Z7901 Long term (current) use of anticoagulants: Secondary | ICD-10-CM

## 2018-12-12 DIAGNOSIS — Z888 Allergy status to other drugs, medicaments and biological substances status: Secondary | ICD-10-CM | POA: Diagnosis not present

## 2018-12-12 DIAGNOSIS — D649 Anemia, unspecified: Secondary | ICD-10-CM | POA: Diagnosis not present

## 2018-12-12 DIAGNOSIS — J9600 Acute respiratory failure, unspecified whether with hypoxia or hypercapnia: Secondary | ICD-10-CM | POA: Diagnosis not present

## 2018-12-12 DIAGNOSIS — Z79899 Other long term (current) drug therapy: Secondary | ICD-10-CM

## 2018-12-12 DIAGNOSIS — R569 Unspecified convulsions: Secondary | ICD-10-CM | POA: Diagnosis not present

## 2018-12-12 DIAGNOSIS — G40911 Epilepsy, unspecified, intractable, with status epilepticus: Principal | ICD-10-CM | POA: Diagnosis present

## 2018-12-12 DIAGNOSIS — G40901 Epilepsy, unspecified, not intractable, with status epilepticus: Secondary | ICD-10-CM | POA: Diagnosis not present

## 2018-12-12 DIAGNOSIS — Z90710 Acquired absence of both cervix and uterus: Secondary | ICD-10-CM

## 2018-12-12 DIAGNOSIS — I82409 Acute embolism and thrombosis of unspecified deep veins of unspecified lower extremity: Secondary | ICD-10-CM | POA: Diagnosis not present

## 2018-12-12 DIAGNOSIS — M5136 Other intervertebral disc degeneration, lumbar region: Secondary | ICD-10-CM | POA: Diagnosis present

## 2018-12-12 DIAGNOSIS — R609 Edema, unspecified: Secondary | ICD-10-CM

## 2018-12-12 DIAGNOSIS — Z4659 Encounter for fitting and adjustment of other gastrointestinal appliance and device: Secondary | ICD-10-CM

## 2018-12-12 DIAGNOSIS — J811 Chronic pulmonary edema: Secondary | ICD-10-CM

## 2018-12-12 DIAGNOSIS — Z85038 Personal history of other malignant neoplasm of large intestine: Secondary | ICD-10-CM

## 2018-12-12 DIAGNOSIS — I10 Essential (primary) hypertension: Secondary | ICD-10-CM | POA: Diagnosis not present

## 2018-12-12 DIAGNOSIS — B004 Herpesviral encephalitis: Secondary | ICD-10-CM | POA: Diagnosis not present

## 2018-12-12 DIAGNOSIS — J9690 Respiratory failure, unspecified, unspecified whether with hypoxia or hypercapnia: Secondary | ICD-10-CM

## 2018-12-12 DIAGNOSIS — Z6831 Body mass index (BMI) 31.0-31.9, adult: Secondary | ICD-10-CM

## 2018-12-12 DIAGNOSIS — G0490 Encephalitis and encephalomyelitis, unspecified: Secondary | ICD-10-CM

## 2018-12-12 DIAGNOSIS — Z86711 Personal history of pulmonary embolism: Secondary | ICD-10-CM

## 2018-12-12 DIAGNOSIS — M47816 Spondylosis without myelopathy or radiculopathy, lumbar region: Secondary | ICD-10-CM | POA: Diagnosis not present

## 2018-12-12 DIAGNOSIS — Z7189 Other specified counseling: Secondary | ICD-10-CM

## 2018-12-12 DIAGNOSIS — Z86718 Personal history of other venous thrombosis and embolism: Secondary | ICD-10-CM

## 2018-12-12 DIAGNOSIS — Z823 Family history of stroke: Secondary | ICD-10-CM

## 2018-12-12 DIAGNOSIS — Z0189 Encounter for other specified special examinations: Secondary | ICD-10-CM

## 2018-12-12 DIAGNOSIS — Z833 Family history of diabetes mellitus: Secondary | ICD-10-CM

## 2018-12-12 DIAGNOSIS — E785 Hyperlipidemia, unspecified: Secondary | ICD-10-CM | POA: Diagnosis not present

## 2018-12-12 HISTORY — DX: Calculus of gallbladder without cholecystitis without obstruction: K80.20

## 2018-12-12 HISTORY — DX: Cerebral infarction, unspecified: I63.9

## 2018-12-12 HISTORY — DX: Other pulmonary embolism without acute cor pulmonale: I26.99

## 2018-12-12 HISTORY — DX: Pulmonary fibrosis, unspecified: J84.10

## 2018-12-12 HISTORY — DX: Malignant neoplasm of colon, unspecified: C18.9

## 2018-12-12 HISTORY — DX: Hyperlipidemia, unspecified: E78.5

## 2018-12-12 HISTORY — DX: Hematuria, unspecified: R31.9

## 2018-12-12 HISTORY — DX: Spondylosis without myelopathy or radiculopathy, lumbar region: M47.816

## 2018-12-12 HISTORY — DX: Cyst of kidney, acquired: N28.1

## 2018-12-12 LAB — POCT I-STAT 7, (LYTES, BLD GAS, ICA,H+H)
Acid-Base Excess: 4 mmol/L — ABNORMAL HIGH (ref 0.0–2.0)
Bicarbonate: 29 mmol/L — ABNORMAL HIGH (ref 20.0–28.0)
Calcium, Ion: 1.25 mmol/L (ref 1.15–1.40)
HCT: 41 % (ref 36.0–46.0)
HEMOGLOBIN: 13.9 g/dL (ref 12.0–15.0)
O2 Saturation: 99 %
Patient temperature: 98.6
Potassium: 3.7 mmol/L (ref 3.5–5.1)
SODIUM: 137 mmol/L (ref 135–145)
TCO2: 30 mmol/L (ref 22–32)
pCO2 arterial: 43 mmHg (ref 32.0–48.0)
pH, Arterial: 7.437 (ref 7.350–7.450)
pO2, Arterial: 135 mmHg — ABNORMAL HIGH (ref 83.0–108.0)

## 2018-12-12 LAB — PROTIME-INR
INR: 1.4 — ABNORMAL HIGH (ref 0.8–1.2)
Prothrombin Time: 16.8 seconds — ABNORMAL HIGH (ref 11.4–15.2)

## 2018-12-12 LAB — COMPREHENSIVE METABOLIC PANEL
ALT: 12 U/L (ref 0–44)
AST: 16 U/L (ref 15–41)
Albumin: 3.4 g/dL — ABNORMAL LOW (ref 3.5–5.0)
Alkaline Phosphatase: 64 U/L (ref 38–126)
Anion gap: 13 (ref 5–15)
BUN: 11 mg/dL (ref 8–23)
CO2: 25 mmol/L (ref 22–32)
Calcium: 9.7 mg/dL (ref 8.9–10.3)
Chloride: 100 mmol/L (ref 98–111)
Creatinine, Ser: 0.87 mg/dL (ref 0.44–1.00)
GFR calc Af Amer: >60 mL/min (ref >60)
Glucose, Bld: 115 mg/dL — ABNORMAL HIGH (ref 70–99)
Potassium: 3.6 mmol/L (ref 3.5–5.1)
Sodium: 138 mmol/L (ref 135–145)
Total Bilirubin: 1 mg/dL (ref 0.3–1.2)
Total Protein: 6.9 g/dL (ref 6.5–8.1)

## 2018-12-12 LAB — CBC WITH DIFFERENTIAL/PLATELET
Abs Immature Granulocytes: 0.07 10*3/uL (ref 0.00–0.07)
Basophils Absolute: 0.1 10*3/uL (ref 0.0–0.1)
Basophils Relative: 0 %
EOS ABS: 0.2 10*3/uL (ref 0.0–0.5)
Eosinophils Relative: 2 %
HCT: 44.8 % (ref 36.0–46.0)
Hemoglobin: 13.9 g/dL (ref 12.0–15.0)
Immature Granulocytes: 1 %
Lymphocytes Relative: 16 %
Lymphs Abs: 2.1 10*3/uL (ref 0.7–4.0)
MCH: 28.5 pg (ref 26.0–34.0)
MCHC: 31 g/dL (ref 30.0–36.0)
MCV: 92 fL (ref 80.0–100.0)
Monocytes Absolute: 0.9 10*3/uL (ref 0.1–1.0)
Monocytes Relative: 7 %
NRBC: 0 % (ref 0.0–0.2)
Neutro Abs: 9.5 10*3/uL — ABNORMAL HIGH (ref 1.7–7.7)
Neutrophils Relative %: 74 %
Platelets: 206 10*3/uL (ref 150–400)
RBC: 4.87 MIL/uL (ref 3.87–5.11)
RDW: 16.4 % — AB (ref 11.5–15.5)
WBC: 12.7 10*3/uL — ABNORMAL HIGH (ref 4.0–10.5)

## 2018-12-12 LAB — MAGNESIUM: Magnesium: 2 mg/dL (ref 1.7–2.4)

## 2018-12-12 LAB — MRSA PCR SCREENING: MRSA by PCR: NEGATIVE

## 2018-12-12 LAB — PHOSPHORUS: Phosphorus: 4 mg/dL (ref 2.5–4.6)

## 2018-12-12 MED ORDER — DOCUSATE SODIUM 50 MG/5ML PO LIQD
100.0000 mg | Freq: Two times a day (BID) | ORAL | Status: DC | PRN
  Administered 2018-12-17 – 2018-12-18 (×2): 100 mg
  Filled 2018-12-12 (×3): qty 10

## 2018-12-12 MED ORDER — CHLORHEXIDINE GLUCONATE 0.12% ORAL RINSE (MEDLINE KIT)
15.0000 mL | Freq: Two times a day (BID) | OROMUCOSAL | Status: DC
  Administered 2018-12-12 – 2018-12-24 (×25): 15 mL via OROMUCOSAL

## 2018-12-12 MED ORDER — SODIUM CHLORIDE 0.9 % IV SOLN
INTRAVENOUS | Status: DC | PRN
  Administered 2018-12-12 – 2018-12-20 (×4): via INTRAVENOUS

## 2018-12-12 MED ORDER — FENTANYL CITRATE (PF) 100 MCG/2ML IJ SOLN
50.0000 ug | INTRAMUSCULAR | Status: DC | PRN
  Administered 2018-12-12 – 2018-12-13 (×2): 50 ug via INTRAVENOUS
  Filled 2018-12-12 (×4): qty 2

## 2018-12-12 MED ORDER — PANTOPRAZOLE SODIUM 40 MG IV SOLR
40.0000 mg | Freq: Two times a day (BID) | INTRAVENOUS | Status: DC
  Administered 2018-12-12 – 2018-12-13 (×3): 40 mg via INTRAVENOUS
  Filled 2018-12-12 (×3): qty 40

## 2018-12-12 MED ORDER — ALBUTEROL SULFATE (2.5 MG/3ML) 0.083% IN NEBU: 2.5000 mg | INHALATION_SOLUTION | Freq: Four times a day (QID) | RESPIRATORY_TRACT | Status: DC | PRN

## 2018-12-12 MED ORDER — MIDAZOLAM HCL 2 MG/2ML IJ SOLN
1.0000 mg | INTRAMUSCULAR | Status: DC | PRN
  Administered 2018-12-14: 1 mg via INTRAVENOUS

## 2018-12-12 MED ORDER — LEVETIRACETAM IN NACL 1000 MG/100ML IV SOLN
1000.0000 mg | Freq: Two times a day (BID) | INTRAVENOUS | Status: DC
  Administered 2018-12-12 – 2018-12-14 (×4): 1000 mg via INTRAVENOUS
  Filled 2018-12-12 (×4): qty 100

## 2018-12-12 MED ORDER — BISACODYL 10 MG RE SUPP: 10.0000 mg | Freq: Every day | RECTAL | Status: DC | PRN

## 2018-12-12 MED ORDER — MIDAZOLAM HCL 2 MG/2ML IJ SOLN
1.0000 mg | INTRAMUSCULAR | Status: DC | PRN
  Administered 2018-12-12 – 2018-12-13 (×2): 1 mg via INTRAVENOUS
  Filled 2018-12-12 (×3): qty 2

## 2018-12-12 MED ORDER — FENTANYL CITRATE (PF) 100 MCG/2ML IJ SOLN
50.0000 ug | INTRAMUSCULAR | Status: DC | PRN
  Administered 2018-12-13 – 2018-12-16 (×6): 50 ug via INTRAVENOUS
  Filled 2018-12-12 (×5): qty 2

## 2018-12-12 MED ORDER — ORAL CARE MOUTH RINSE
15.0000 mL | OROMUCOSAL | Status: DC
  Administered 2018-12-12 – 2018-12-25 (×125): 15 mL via OROMUCOSAL

## 2018-12-12 NOTE — H&P (Signed)
NAME:  Holly Cooper, MRN:  [id number], DOB:  1934-06-15, LOS: 0 ADMISSION DATE:  12/12/2018,  CHIEF COMPLAINT:  Seizures, Acute resp failure   Brief History   85 year old woman, history of CVA, history DVT/PE on Eliquis (question compliance). Experienced recurrent seizures that were treated initially with medazepam and then with Keppra.  She was intubated for failure to protect her airway.  Transfer is now to [facility] for further evaluation mechanically ventilated.   History of present illness   85 year old woman, history of CVA, history DVT/PE on Eliquis (question compliance). Last seen normal around 3 AM 3/27, then found at home the next morning with eye deviation and unresponsiveness.  She received Versed for seizure activity from EMS, was receiving bag mask ventilation on arrival to the emergency department.  She required endotracheal intubation.  She had another episode of right gaze preference and apparent seizure activity.  Received Keppra.  A CT scan of her head/CTA did not show any abnormality or acute CVA.  She again experienced similar seizure syndrome, received benzodiazepines and was transferred for further care.  Past Medical History  CVA DVT/ PE Hyperlipidemia obesity  Significant Hospital Events     Consults:  Neurology  Procedures:  ETT 3/27 >>   Significant Diagnostic Tests:  Head CT/ CTA 3/27 >> no evidence CVA, no evidence of large vessel occlusion  Micro Data:    Antimicrobials:     Interim history/subjective:  Mechanically ventilated  Objective   Pulse 72, resp. rate 15, SpO2 100 %.    Vent Mode: PRVC FiO2 (%):  [40 %] 40 % Set Rate:  [14 bmp] 14 bmp Vt Set:  [450 mL] 450 mL PEEP:  [5 cmH20] 5 cmH20  No intake or output data in the 24 hours ending 12/12/18 1735 There were no vitals filed for this visit.  Examination: General: Obese ill-appearing woman, mechanically ventilated HENT: ET tube in good position, oropharynx clear Lungs: Lungs coarse  bilaterally without wheezing Cardiovascular: Regular, no murmur Abdomen: Obese, soft, nondistended, positive bowel sounds Extremities: No significant edema Neuro: As per Dr Cecil Cobbs exam, right corneal absent, cough present, midline gaze, weaker on the right UE and LE  Resolved Hospital Problem list     Assessment & Plan:  Acute encephalopathy  Status epilepticus Reported history CVA/TIA, question medical compliance Keppra as recommended by neurology  Continuous EEG monitoring to be initiated 3/27 Plan for MRI brain  Acute respiratory failure due to failure to protect airway PRVC 8 cc/kg Chest x-ray and ABG now VAP prevention orders We will use light sedation, plan to assess her for spontaneous breathing trials when mental status will allow.  Intermittent Versed, fentanyl  History of DVT/PE Hold off on anticoagulation at this time given concern for possible early CVA, risk for hemorrhagic conversion.  History hypertension Home antihypertensive regimen on hold currently  Nutrition Initiate TF 3/28 if she does not extubate quickly    Best practice:  Diet: N.p.o. Pain/Anxiety/Delirium protocol (if indicated): Intermittent Versed, fentanyl VAP protocol (if indicated): Ordered 3/27 DVT prophylaxis: SCD GI prophylaxis: Protonix Glucose control: NA Mobility: Bedrest Code Status: Full code Family Communication: No family present currently Disposition: ICU  Labs   CBC: No results for input(s): WBC, NEUTROABS, HGB, HCT, MCV, PLT in the last 168 hours. WBC 10.8, hemoglobin 14.8, platelets 222, normal differential  Basic Metabolic Panel: No results for input(s): NA, K, CL, CO2, GLUCOSE, BUN, CREATININE, CALCIUM, MG, PHOS in the last 168 hours. GFR: CrCl cannot be calculated (No successful  lab value found.). No results for input(s): PROCALCITON, WBC, LATICACIDVEN in the last 168 hours. Sodium 139, potassium 3.9, chloride 103, CO2 25, glucose 124, BUN 15, creatinine  0.8, calcium 9.5, albumin 4.2  Liver Function Tests: No results for input(s): AST, ALT, ALKPHOS, BILITOT, PROT, ALBUMIN in the last 168 hours. No results for input(s): LIPASE, AMYLASE in the last 168 hours. No results for input(s): AMMONIA in the last 168 hours. LFTs normal  ABG No results found for: PHART, PCO2ART, PO2ART, HCO3, TCO2, ACIDBASEDEF, O2SAT   Coagulation Profile: No results for input(s): INR, PROTIME in the last 168 hours. INR 1.5 Cardiac Enzymes: No results for input(s): CKTOTAL, CKMB, CKMBINDEX, TROPONINI in the last 168 hours.  HbA1C: No results found for: HGBA1C  CBG: No results for input(s): GLUCAP in the last 168 hours.  Review of Systems:   Unable to obtain   Social History     She lives with family at home, some report from transfer notes that she is largely bed bound (unclear), never smoker  Family History   Multiple family members with hypertension, diabetes in grandmother, cancer in her brothers and father  Allergies Allergies not on file   Home Medications  Prior to Admission medications   Not on File     Critical care time: [place], MD, PhD 12/12/2018, 5:59 PM [facility] [phone] or if no answer [phone]

## 2018-12-12 NOTE — Progress Notes (Signed)
Ring removed, put in valuables envelope and given to security. Copy of paperwork is in chart.

## 2018-12-12 NOTE — Progress Notes (Signed)
LTM EEG started 

## 2018-12-12 NOTE — Consult Note (Addendum)
Neurology Consultation Reason for Consult: Suspected seizures Referring Physician: Byrum, R  CC: Suspected seizures  History is obtained from: Chart review, [facility]  HPI: Holly Cooper is a 85 y.o. female with a history of previous stroke who presents with unresponsiveness with subsequent witnessed focal seizures.  Unfortunately, I do not have contact information for family and the one possible phone number we have does not work.  She was last seen well at 3 AM, then found this morning by family when she did not arise at her normal time.  She was found with eye deviation, no definite seizure activity per report.  EMS was called with concern for stroke and en route she developed seizure activity and was given Versed.  On arrival to [facility] ER she was felt to have a respiratory arrest and therefore was intubated.   She had a CT angiogram which demonstrates no large vessel occlusion and had multiple episodes of seizure-like activity in the emergency department there.  She was loaded with 1 g of IV Keppra   LKW: 3 AM tpa given?: no, outside of window, anticoagulated   ROS: Unable to obtain due to altered mental status.   PMH: PE and DVT on eliquis htn ?  Previous CVA  Social History: Unable to assess secondary to patient's altered mental status.    Exam: Current vital signs: Vitals:   12/12/18 1723  Pulse: 72  Resp: 15  SpO2: 100%   Vital signs in last 24 hours:     Physical Exam  Constitutional: Appears elderly Psych: Affect appropriate to situation Eyes: No scleral injection HENT: ET tube in place Head: Normocephalic.  Cardiovascular: Normal rate and regular rhythm.  Respiratory: Ventilated GI: Soft.  No distension. There is no tenderness.  Skin: WDI  Neuro: Mental Status: Patient is obtunded, does not open eyes or follow commands.  Cranial Nerves: TJ:QZES not blink to threat.  Pupils are small bilaterally.    III,IV, VI: eyes are midline, she resists doll's  enough that response is unclear.  V, VII: No corneal response on the right.  X: cough present Motor: She has quasi-purposeful movement of both arms and brisk withdrawal of both legs to noxious stimulation  sensory: She responds to noxious stimulation in all 4 extremities  Cerebellar: Does not perform   I have reviewed labs in epic and the results pertinent to this consultation are: Cr 0.8 Na 139 K 3.9 Glucose 124 Ca 9.5  CBC- WBC 10.8 HCT 45  I have reviewed the images obtained: CT head -poor quality CT, but no definite acute findings, CT angiogram-no large vessel occlusion, some right ICA stenosis.  Impression: 85 year old female with new onset seizures in the setting of previous CVA.  No definite seizure focus that I see on her initial CT, but certainly possible that her previous CVA could be contributing.  With right-sided gaze and right-sided weakness, seizure is the most likely explanation, and with improvement following treatment, I suspect that this does not represent ongoing status epilepticus, however given that she has not returned to baseline I would favor EEG monitoring. No fever or antecedent illness to suggest infectious etiology.   Recommendations: 1) continuous EEG to assess for subclinical continue seizure and quantify seizure activity 2) continue Keppra at 1 g twice daily 3) MRI brain 4) further recommendations following continuous EEG   Roland Rack, MD [facility] [phone]  If 7pm- 7am, please page neurology on call as listed in [place].

## 2018-12-13 DIAGNOSIS — G40901 Epilepsy, unspecified, not intractable, with status epilepticus: Secondary | ICD-10-CM

## 2018-12-13 DIAGNOSIS — G9341 Metabolic encephalopathy: Secondary | ICD-10-CM

## 2018-12-13 DIAGNOSIS — Z8673 Personal history of transient ischemic attack (TIA), and cerebral infarction without residual deficits: Secondary | ICD-10-CM

## 2018-12-13 DIAGNOSIS — Z9911 Dependence on respirator [ventilator] status: Secondary | ICD-10-CM

## 2018-12-13 LAB — GLUCOSE, CAPILLARY
Glucose-Capillary: 76 mg/dL (ref 70–99)
Glucose-Capillary: 76 mg/dL (ref 70–99)
Glucose-Capillary: 95 mg/dL (ref 70–99)
Glucose-Capillary: 96 mg/dL (ref 70–99)
Glucose-Capillary: 118 mg/dL — ABNORMAL HIGH (ref 70–99)
Glucose-Capillary: 110 mg/dL — ABNORMAL HIGH (ref 70–99)

## 2018-12-13 LAB — BASIC METABOLIC PANEL
ANION GAP: 14 (ref 5–15)
BUN: 12 mg/dL (ref 8–23)
CO2: 23 mmol/L (ref 22–32)
Calcium: 9.3 mg/dL (ref 8.9–10.3)
Chloride: 102 mmol/L (ref 98–111)
Creatinine, Ser: 0.9 mg/dL (ref 0.44–1.00)
GFR calc Af Amer: >60 mL/min (ref >60)
GFR calc non Af Amer: 59 mL/min — ABNORMAL LOW (ref >60)
Glucose, Bld: 126 mg/dL — ABNORMAL HIGH (ref 70–99)
Potassium: 4 mmol/L (ref 3.5–5.1)
Sodium: 139 mmol/L (ref 135–145)

## 2018-12-13 LAB — CBC
HCT: 43.5 % (ref 36.0–46.0)
Hemoglobin: 13.8 g/dL (ref 12.0–15.0)
MCH: 29.2 pg (ref 26.0–34.0)
MCHC: 31.7 g/dL (ref 30.0–36.0)
MCV: 92 fL (ref 80.0–100.0)
Platelets: 195 10*3/uL (ref 150–400)
RBC: 4.73 MIL/uL (ref 3.87–5.11)
RDW: 16.6 % — AB (ref 11.5–15.5)
WBC: 11.6 10*3/uL — AB (ref 4.0–10.5)
nRBC: 0 % (ref 0.0–0.2)

## 2018-12-13 LAB — PHOSPHORUS
Phosphorus: 4.6 mg/dL (ref 2.5–4.6)
Phosphorus: 4.4 mg/dL (ref 2.5–4.6)
Phosphorus: 4.2 mg/dL (ref 2.5–4.6)

## 2018-12-13 LAB — MAGNESIUM
MAGNESIUM: 2 mg/dL (ref 1.7–2.4)
Magnesium: 2 mg/dL (ref 1.7–2.4)
Magnesium: 2 mg/dL (ref 1.7–2.4)

## 2018-12-13 MED ORDER — VITAL HIGH PROTEIN PO LIQD: 1000.0000 mL | ORAL | Status: DC

## 2018-12-13 MED ORDER — VITAL HIGH PROTEIN PO LIQD
1000.0000 mL | ORAL | Status: DC
  Administered 2018-12-13 – 2018-12-15 (×3): 1000 mL
  Administered 2018-12-15: 10:00:00
  Administered 2018-12-16 – 2018-12-19 (×4): 1000 mL

## 2018-12-13 MED ORDER — PRO-STAT SUGAR FREE PO LIQD: 30.0000 mL | Freq: Two times a day (BID) | ORAL | Status: DC

## 2018-12-13 MED ORDER — SODIUM CHLORIDE 0.9 % IV SOLN
1500.0000 mg | Freq: Once | INTRAVENOUS | Status: AC
  Administered 2018-12-13: 1500 mg via INTRAVENOUS
  Filled 2018-12-13: qty 30

## 2018-12-13 MED ORDER — PRO-STAT SUGAR FREE PO LIQD
60.0000 mL | Freq: Every day | ORAL | Status: DC
  Administered 2018-12-14 – 2018-12-19 (×6): 60 mL
  Filled 2018-12-13 (×6): qty 60

## 2018-12-13 MED ORDER — PHENYTOIN SODIUM 50 MG/ML IJ SOLN
100.0000 mg | Freq: Three times a day (TID) | INTRAMUSCULAR | Status: DC
  Administered 2018-12-13 – 2018-12-26 (×39): 100 mg via INTRAVENOUS
  Filled 2018-12-13 (×39): qty 2

## 2018-12-13 NOTE — Procedures (Signed)
  Electroencephalogram report- LTM with VIDEO  Ordering Physician : kirkpatrick  Beginning time: 12/12/18 at 18 12  Ending time:  12/13/18 at 0800 CPT/type : 95720  Day of study: day 1    Technical Description: The EEG was performed using standard setting per the guidelines of American Clinical Neurophysiology Society (ACNS).    A minimum of 21 electrodes were placed on scalp according to the International 10-20 or/and 10-10 Systems. Supplemental electrodes were placed as needed. Single EKG electrode was also used to detect cardiac arrhythmia. Patient's behavior was continuously recorded on video simultaneously with EEG. A minimum of 16 channels were used for data display. Each epoch of study was reviewed manually daily and as needed using standard referential and bipolar montages. Computerized quantitative EEG analysis (such as compressed spectral array analysis, trending, automated spike & seizure detection) were used as indicated.    Spike detection: ON  Seizure detection: ON   This 13 hours of continuous  EEG monitoring with simultaneous video monitoring was performed for this patient with convulsions to rule out clinical and subclinical electrographic seizures.  Medications: As per EMR  There was no pushbutton activations events during this recording.  Background activities marked by continuous reactive background activities with stage changes ranging between 4 cps to 7 cycles.  Faster frequencies better developed on the right as opposed to left.  Left suggestive of cortical irritability present throughout the recording occur every 2 to 3 seconds with maximum negativity at F7. At times left frontotemporal sharp waves and spikes occur in short evolving trains.  No clinical subclinical seizures present   Clinical interpretation: This 13 hours of continuous EEG monitoring with simultaneous video monitoring did not record any clinical or subclinical seizures.  Background activities were  abnormal for several reasons.  #1 background activity slowing suggestive of mild encephalopathy of nonspecific etiologies #2 left hemispheric periodic lateralized epileptiform discharges suggestive of cortical irritability particularly in the left anterior frontotemporal region.  Continues monitoring is recommended to ensure resolution of clinical and subclinical seizures.  Clinical correlation is advised.

## 2018-12-13 NOTE — Progress Notes (Signed)
Initial Nutrition Assessment  DOCUMENTATION CODES:   Obesity unspecified  INTERVENTION:   Initiate TF via OGT with Vital High Protein at goal rate of 45 ml/h (1080 ml per day) and Prostat 60 ml daily to provide 1280 kcals, 125 gm protein, 903 ml free water daily.  NUTRITION DIAGNOSIS:   Inadequate oral intake related to inability to eat as evidenced by NPO status.  GOAL:   Provide needs based on ASPEN/SCCM guidelines  MONITOR:   Vent status, Labs, Weight trends, Skin, TF tolerance, I & O's  REASON FOR ASSESSMENT:   Ventilator, Consult Enteral/tube feeding initiation and management  ASSESSMENT:   85 year old woman, history of CVA, history DVT/PE on Eliquis (question compliance). Experienced recurrent seizures that were treated initially with medazepam and then with Keppra.  She was intubated for failure to protect her airway.  Transfer is now to [facility] for further evaluation mechanically ventilated.   Pt admitted with acute encephalopathy, status epilepticus.   Per neurology notes, seizure likely from old CVA.   Patient is currently intubated on ventilator support. OGT in placed; currently clamped.  MV: 10.9 L/min Temp (24hrs), Avg:98.7 F (37.1 C), Min:98.3 F (36.8 C), Max:99.3 F (37.4 C)  Reviewed I/O's: -224 ml x 24 hours  UOP: 600 ml x 24 hours  No current ht or wt available. Reviewed records [facility] ([facility] Last recorded wt and ht from 11/14/18 (66"/87.5 kg); these were used to assess estimated needs. Per Care[facility]where, wt has been stable over the past 6 months.   Labs reviewed: CBGS: 76-96.   NUTRITION - FOCUSED PHYSICAL EXAM:    Most Recent Value  Orbital Region  Unable to assess  Upper Arm Region  Unable to assess  Thoracic and Lumbar Region  Unable to assess  Buccal Region  Unable to assess  Temple Region  Unable to assess  Clavicle Bone Region  Unable to assess  Clavicle and Acromion Bone Region  Unable to assess  Scapular Bone Region  Unable  to assess  Dorsal Hand  Unable to assess  Patellar Region  Unable to assess  Anterior Thigh Region  Unable to assess  Posterior Calf Region  Unable to assess  Edema (RD Assessment)  Unable to assess  Hair  Unable to assess  Eyes  Unable to assess  Mouth  Unable to assess  Skin  Unable to assess  Nails  Unable to assess       Diet Order:   Diet Order            Diet NPO time specified  Diet effective now              EDUCATION NEEDS:   Not appropriate for education at this time  Skin:  Skin Assessment: Reviewed RN Assessment  Last BM:  Unknown  Height:   Ht Readings from Last 1 Encounters:  12/13/18 5\' 6"  (1.676 m)    Weight:   Wt Readings from Last 1 Encounters:  12/13/18 87.5 kg    Ideal Body Weight:  59.1 kg  BMI:  Body mass index is 31.14 kg/m.  Estimated Nutritional Needs:   Kcal:  248-2500  Protein:  > 118 grams  Fluid:  > 1.1 L    Jefry Lesinski A. Jimmye Norman, RD, LDN, [place] Registered Dietitian II Certified Diabetes Care and Education Specialist Pager: [phone] After hours Pager: [phone]

## 2018-12-13 NOTE — Plan of Care (Signed)
  Problem: Nutrition: Goal: Adequate nutrition will be maintained Outcome: Progressing   

## 2018-12-13 NOTE — Progress Notes (Addendum)
Neurology Progress Note  S:// Patient seen and examined. Has been on LTM EEG: Continues to have Left PLEDs - official read pending Will add Vimpat load 225m now, then 1013mBID  O:// Current vital signs: BP 118/71   Pulse 63   Temp 99.3 F (37.4 C) (Oral)   Resp 19   SpO2 100%  Vital signs in last 24 hours: Temp:  [98.3 F (36.8 C)-99.3 F (37.4 C)] 99.3 F (37.4 C) (03/28 0400) Pulse Rate:  [61-76] 63 (03/28 0700) Resp:  [14-31] 19 (03/28 0700) BP: (79-162)/(47-97) 118/71 (03/28 0700) SpO2:  [88 %-100 %] 100 % (03/28 0700) FiO2 (%):  [40 %] 40 % (03/28 0340) General: On no sedation, intubated, drowsy. HEENT: Normocephalic atraumatic endotracheal tube in place Extremities warm well perfused Abdomen nondistended nontender Respiratory: Breathing over the ventilator Cardiovascular: Regular rate rhythm Neurological exam Patient is not on any sedation, intubated, drowsy, opens eyes to voice easily, follows simple commands, gives thumbs up & wiggles toes, but cannot make #2. Tracks throughout room on both sides. Shakes her head "yes" to every question even when incorrect. She is nonverbal d/t ETT Cranial nerves: Pupils are reactive, 52m8milaterally, no gaze deviation or preference, facial symmetry is difficult to ascertain with endotracheal tube in place. Motor exam: She follows commands in all fours but is not able to raise them antigravity in any of the 4 extremities. Sensory exam: Grimaces to stimuli, no noxious stimuli required. . Coordination cannot be tested Gait cannot be tested  Medications  Current Facility-Administered Medications:  .  0.9 %  sodium chloride infusion, , Intravenous, PRN, ByrCollene GobbleD, Last Rate: 10 mL/hr at 12/13/18 0700 .  albuterol (PROVENTIL) (2.5 MG/3ML) 0.083% nebulizer solution 2.5 mg, 2.5 mg, Nebulization, Q6H PRN, SimJennelle Human NP .  bisacodyl (DULCOLAX) suppository 10 mg, 10 mg, Rectal, Daily PRN, SimJennelle Human NP .   chlorhexidine gluconate (MEDLINE KIT) (PERIDEX) 0.12 % solution 15 mL, 15 mL, Mouth Rinse, BID, Simpson, Paula B, NP, 15 mL at 12/12/18 2000 .  docusate (COLACE) 50 MG/5ML liquid 100 mg, 100 mg, Per Tube, BID PRN, SimJennelle Human NP .  fentaNYL (SUBLIMAZE) injection 50 mcg, 50 mcg, Intravenous, Q15 min PRN, SimJennelle Human NP, 50 mcg at 12/13/18 0222 .  fentaNYL (SUBLIMAZE) injection 50 mcg, 50 mcg, Intravenous, Q2H PRN, SimJennelle Human NP .  levETIRAcetam (KEPPRA) IVPB 1000 mg/100 mL premix, 1,000 mg, Intravenous, Q12H, KirGreta DoomD, Last Rate: 440 mL/hr at 12/13/18 0720, 1,000 mg at 12/13/18 0720 .  MEDLINE mouth rinse, 15 mL, Mouth Rinse, 10 times per day, SimJennelle Human NP, 15 mL at 12/13/18 0600 .  midazolam (VERSED) injection 1 mg, 1 mg, Intravenous, Q15 min PRN, SimJennelle Human NP, 1 mg at 12/13/18 0222 .  midazolam (VERSED) injection 1 mg, 1 mg, Intravenous, Q2H PRN, SimJennelle Human NP .  pantoprazole (PROTONIX) injection 40 mg, 40 mg, Intravenous, Q12H, SimJennelle Human NP, 40 mg at 12/12/18 2223  Labs CBC    Component Value Date/Time   WBC 11.6 (H) 12/13/2018 0534   RBC 4.73 12/13/2018 0534   HGB 13.8 12/13/2018 0534   HCT 43.5 12/13/2018 0534   PLT 195 12/13/2018 0534   MCV 92.0 12/13/2018 0534   MCH 29.2 12/13/2018 0534   MCHC 31.7 12/13/2018 0534   RDW 16.6 (H) 12/13/2018 0534   LYMPHSABS 2.1 12/12/2018 1822   MONOABS 0.9 12/12/2018 1822   EOSABS 0.2 12/12/2018 1822  BASOSABS 0.1 12/12/2018 1822    CMP     Component Value Date/Time   NA 139 12/13/2018 0534   K 4.0 12/13/2018 0534   CL 102 12/13/2018 0534   CO2 23 12/13/2018 0534   GLUCOSE 126 (H) 12/13/2018 0534   BUN 12 12/13/2018 0534   CREATININE 0.90 12/13/2018 0534   CALCIUM 9.3 12/13/2018 0534   PROT 6.9 12/12/2018 1822   ALBUMIN 3.4 (L) 12/12/2018 1822   AST 16 12/12/2018 1822   ALT 12 12/12/2018 1822   ALKPHOS 64 12/12/2018 1822   BILITOT 1.0 12/12/2018 1822   GFRNONAA 59  (L) 12/13/2018 0534   GFRAA >60 12/13/2018 0534   Imaging I have reviewed images in epic and the results pertinent to this consultation are: CT-scan of the brain-no acute changes - old left parietooccipital stroke.  CT angios No LVO, some right ICA stenosis.  Assessment: 85 year old woman with new onset seizures in the setting of an old stroke.  She is brought in as a code stroke in outside hospital but then obviously started seizing on the right with rightward gaze deviation. Intubated due to respiratory failure.  No cardiac arrest.  Repeat seizure after intubation for which she was given benzos.  Loaded with Keppra. Overnight, hooked to LTM EEG-not in status epilepticus. Concern for lateralized periodic discharges- loaded with fosphenytoin. Also has a history of PE and DVT is on Eliquis.  Impression: Status epilepticus Seizure-new onset likely from the old stroke History of stroke History of PE DVT-on Eliquis  Recommendations: Vimpat 22m IV now, then 1061mBID Continue Keppra -1 g twice daily Phenytoin 100 TID - follow levels Add BMP and PHT level to this am labs Maintain seizure precautions Vent management per primary team-appreciate their assistance in management of the patient. Consider MRI of the brain once the EEG leads are off and there is no concern for ongoing status/electrographic disturbance.  Eliquis on hold - can be resumed once MRI is done  Neurology to continue to follow with you.  -- Desiree Metzger-Cihelka, ARNP-C, ANVP-BC [facility]   Attending Neurohospitalist Addendum Patient seen and examined with APP/Resident. Agree with the history and physical as documented above. Agree with the plan as documented, which I helped formulate. I have independently reviewed the chart, obtained history, review of systems and examined the patient.I have personally reviewed pertinent head/neck/spine imaging (CT/MRI). Please feel free to call with any  questions. --- AsAmie PortlandMD [facility] Pager: [phone] 7pm to 7am, please call on call as listed on [facility].   ADDENDUM LTM with continuing LPDs on the left and brief electrographic seizures from left anterior temporal lobe. Also has a leukocytosis. Concern for HSV encephalitis. --LP --Start acyclovir, and meningeal antibiotics. -Increase Keppra to 1250 mg twice daily -Continue LTM -Consider Benzo/propofol if adding vimpat and increasing Keppra does not quiet down the LPDs. We will continue to follow  -- AsAmie PortlandMD [facility] Pager: [phone] 7pm to 7am, please call on call as listed on [facility].   ADDENDUM LP bedside attempted with no CSF return Start Acyclovir and bacterial coverage. D/C abx when CSF -ve for bacterial Continue acyclovir till HSV PCR negative. Continue if positive. Neurology will follow.  -- AsAmie PortlandMD [facility] Pager: [phone] 7pm to 7am, please call on call as listed on [facility].

## 2018-12-13 NOTE — Progress Notes (Signed)
LTM EEG with left LPDs. Will continue AEDs - Keppra and Dilantin for now. Continue LTM. We will follow with you.  -- Amie Portland, MD [facility] Pager: [phone] If 7pm to 7am, please call on call as listed on [facility].

## 2018-12-13 NOTE — Progress Notes (Signed)
NAME:  Holly Cooper, MRN:  [id number], DOB:  17-Oct-1933, LOS: 1 ADMISSION DATE:  12/12/2018,  CHIEF COMPLAINT:  Seizures, Acute resp failure   Brief History   85 year old woman, history of CVA, history DVT/PE on Eliquis (question compliance). Experienced recurrent seizures that were treated initially with medazepam and then with Keppra.  She was intubated for failure to protect her airway.  Transfer is now to [facility] for further evaluation mechanically ventilated.   History of present illness   85 year old woman, history of CVA, history DVT/PE on Eliquis (question compliance). Last seen normal around 3 AM 3/27, then found at home the next morning with eye deviation and unresponsiveness.  She received Versed for seizure activity from EMS, was receiving bag mask ventilation on arrival to the emergency department.  She required endotracheal intubation.  She had another episode of right gaze preference and apparent seizure activity.  Received Keppra.  A CT scan of her head/CTA did not show any abnormality or acute CVA.  She again experienced similar seizure syndrome, received benzodiazepines and was transferred for further care.  Past Medical History  CVA DVT/ PE Hyperlipidemia obesity  Significant Hospital Events   3/28 - loaded with fosphenytoin   Consults:  Neurology  Procedures:  ETT 3/27 >>   Significant Diagnostic Tests:  Head CT/ CTA 3/27 >> no evidence CVA, no evidence of large vessel occlusion  Micro Data:    Antimicrobials:     Interim history/subjective:  No issues overnight.  Patient remains on mechanical ventilator.  With sedation lightened this morning she was able to localize towards tube.  Still not following any commands.  Care discussed with nursing staff at bedside.  We will plan to institute tube feeds today.  Patient remains unresponsive on mechanical ventilator.  Objective   Blood pressure 95/64, pulse 65, temperature 98.9 F (37.2 C), temperature source  Axillary, resp. rate 14, SpO2 100 %.    Vent Mode: PRVC FiO2 (%):  [40 %] 40 % Set Rate:  [14 bmp] 14 bmp Vt Set:  [450 mL] 450 mL PEEP:  [5 cmH20] 5 cmH20 Plateau Pressure:  [15 cmH20-17 cmH20] 15 cmH20   Intake/Output Summary (Last 24 hours) at 12/13/2018 0957 Last data filed at 12/13/2018 0900 Gross per 24 hour  Intake 503.59 ml  Output 750 ml  Net -246.41 ml    Examination: General: Elderly, obese female, intubated on mechanical ventilation HENT: Endotracheal tube in place Lungs: Bilateral ventilated breath sounds, no wheeze Cardiovascular: Regular rate and rhythm, S1-S2, no MRG Abdomen: Soft, nontender nondistended Extremities: No edema, compression SCDs in place Neuro: Not following any commands for me this morning.  Does disappear to spontaneously move both sides.  No nystagmus with lid lift or eye deviation.  Resolved Hospital Problem list     Assessment & Plan:  Acute encephalopathy  Status epilepticus Reported history CVA/TIA, question medical compliance Continue Keppra per neurology Continuous EEG per neurology Plan for brain MRI pending EEG results. AEDs managed per neurology. We appreciate their help  Acute respiratory failure due to failure to protect airway, requiring intubation and mechanical ventilation Patient remains on mechanical ventilator at this time Continue PRVC 8 cc/kg VAP prevention orders in place  History of DVT/PE Patient remains off of anticoagulation at this time for possible early CVA and risk of conversion. We will reinstitute pending MRI studies and discussion with neurology  History hypertension Holding home antihypertensive regimen  Nutrition We will initiate tube feeds today.    Best practice:  Diet: TF start 3/28 Pain/Anxiety/Delirium protocol (if indicated): Intermittent Versed, fentanyl VAP protocol (if indicated): Ordered 3/27 DVT prophylaxis: SCD GI prophylaxis: Protonix Glucose control: NA Mobility: Bedrest Code  Status: Full code Family Communication: No family present currently Disposition: ICU  Labs   CBC: Recent Labs  Lab 12/12/18 1744 12/12/18 1822 12/13/18 0534  WBC  --  12.7* 11.6*  NEUTROABS  --  9.5*  --   HGB 13.9 13.9 13.8  HCT 41.0 44.8 43.5  MCV  --  92.0 92.0  PLT  --  206 734    Basic Metabolic Panel: Recent Labs  Lab 12/12/18 1744 12/12/18 1822 12/13/18 0534  NA 137 138 139  K 3.7 3.6 4.0  CL  --  100 102  CO2  --  25 23  GLUCOSE  --  115* 126*  BUN  --  11 12  CREATININE  --  0.87 0.90  CALCIUM  --  9.7 9.3  MG  --  2.0 2.0  PHOS  --  4.0 4.6   GFR: CrCl cannot be calculated (Unknown ideal weight.). Recent Labs  Lab 12/12/18 1822 12/13/18 0534  WBC 12.7* 11.6*    Liver Function Tests: Recent Labs  Lab 12/12/18 1822  AST 16  ALT 12  ALKPHOS 64  BILITOT 1.0  PROT 6.9  ALBUMIN 3.4*   No results for input(s): LIPASE, AMYLASE in the last 168 hours. No results for input(s): AMMONIA in the last 168 hours.   ABG    Component Value Date/Time   PHART 7.437 12/12/2018 1744   PCO2ART 43.0 12/12/2018 1744   PO2ART 135.0 (H) 12/12/2018 1744   HCO3 29.0 (H) 12/12/2018 1744   TCO2 30 12/12/2018 1744   O2SAT 99.0 12/12/2018 1744     Coagulation Profile: Recent Labs  Lab 12/12/18 1822  INR 1.4*    Cardiac Enzymes: No results for input(s): CKTOTAL, CKMB, CKMBINDEX, TROPONINI in the last 168 hours.  HbA1C: No results found for: HGBA1C  CBG: Recent Labs  Lab 12/13/18 0112 12/13/18 0330 12/13/18 0835  GLUCAP 76 76 95    This patient is critically ill with multiple organ system failure; which, requires frequent high complexity decision making, assessment, support, evaluation, and titration of therapies. This was completed through the application of advanced monitoring technologies and extensive interpretation of multiple databases. During this encounter critical care time was devoted to patient care services described in this note for 32  minutes.   Garner Nash, DO [facility] 12/13/2018 9:57 AM  Personal pager: [phone] If unanswered, please page CCM On-call: [phone]

## 2018-12-13 NOTE — Progress Notes (Signed)
Reviewed EEG, showed frequent pseudo-periodic lateralized discharges from the left hemispheric leads. Spoke to nurse, patient no longer had forced gaze deviation.  Overnight, they appeared to become more frequent and increased amplitude and therefore loaded patient with fosphenytoin 20mg  per KG.

## 2018-12-13 NOTE — Progress Notes (Signed)
LTM maint complete - no skin breakdown at fp1, fp2, a2

## 2018-12-14 DIAGNOSIS — Z978 Presence of other specified devices: Secondary | ICD-10-CM

## 2018-12-14 LAB — GLUCOSE, CAPILLARY
GLUCOSE-CAPILLARY: 100 mg/dL — AB (ref 70–99)
GLUCOSE-CAPILLARY: 87 mg/dL (ref 70–99)
GLUCOSE-CAPILLARY: 95 mg/dL (ref 70–99)
GLUCOSE-CAPILLARY: 97 mg/dL (ref 70–99)
Glucose-Capillary: 97 mg/dL (ref 70–99)
Glucose-Capillary: 103 mg/dL — ABNORMAL HIGH (ref 70–99)
Glucose-Capillary: 99 mg/dL (ref 70–99)

## 2018-12-14 LAB — PHENYTOIN LEVEL, TOTAL: Phenytoin Lvl: 16.7 ug/mL (ref 10.0–20.0)

## 2018-12-14 LAB — CBC WITH DIFFERENTIAL/PLATELET
Abs Immature Granulocytes: 0.04 10*3/uL (ref 0.00–0.07)
Basophils Absolute: 0 10*3/uL (ref 0.0–0.1)
Basophils Relative: 0 %
Eosinophils Absolute: 0.2 10*3/uL (ref 0.0–0.5)
Eosinophils Relative: 2 %
HEMATOCRIT: 41.1 % (ref 36.0–46.0)
HEMOGLOBIN: 12.8 g/dL (ref 12.0–15.0)
Immature Granulocytes: 0 %
LYMPHS PCT: 12 %
Lymphs Abs: 1.5 10*3/uL (ref 0.7–4.0)
MCH: 28.8 pg (ref 26.0–34.0)
MCHC: 31.1 g/dL (ref 30.0–36.0)
MCV: 92.6 fL (ref 80.0–100.0)
Monocytes Absolute: 0.9 10*3/uL (ref 0.1–1.0)
Monocytes Relative: 7 %
Neutro Abs: 10.4 10*3/uL — ABNORMAL HIGH (ref 1.7–7.7)
Neutrophils Relative %: 79 %
Platelets: 189 10*3/uL (ref 150–400)
RBC: 4.44 MIL/uL (ref 3.87–5.11)
RDW: 16.8 % — ABNORMAL HIGH (ref 11.5–15.5)
WBC: 13.2 10*3/uL — AB (ref 4.0–10.5)
nRBC: 0 % (ref 0.0–0.2)

## 2018-12-14 LAB — BASIC METABOLIC PANEL
Anion gap: 7 (ref 5–15)
BUN: 25 mg/dL — ABNORMAL HIGH (ref 8–23)
CHLORIDE: 105 mmol/L (ref 98–111)
CO2: 25 mmol/L (ref 22–32)
Calcium: 8.7 mg/dL — ABNORMAL LOW (ref 8.9–10.3)
Creatinine, Ser: 0.83 mg/dL (ref 0.44–1.00)
GFR calc Af Amer: >60 mL/min (ref >60)
GFR calc non Af Amer: >60 mL/min (ref >60)
Glucose, Bld: 122 mg/dL — ABNORMAL HIGH (ref 70–99)
Potassium: 3.6 mmol/L (ref 3.5–5.1)
Sodium: 137 mmol/L (ref 135–145)

## 2018-12-14 LAB — PHOSPHORUS: Phosphorus: 3.6 mg/dL (ref 2.5–4.6)

## 2018-12-14 LAB — MAGNESIUM: Magnesium: 2.1 mg/dL (ref 1.7–2.4)

## 2018-12-14 MED ORDER — MIDAZOLAM 50MG/50ML (1MG/ML) PREMIX INFUSION
2.0000 mg/h | INTRAVENOUS | Status: DC
  Administered 2018-12-14 – 2018-12-16 (×3): 2 mg/h via INTRAVENOUS
  Filled 2018-12-14 (×3): qty 50

## 2018-12-14 MED ORDER — VANCOMYCIN HCL 10 G IV SOLR
1500.0000 mg | Freq: Once | INTRAVENOUS | Status: AC
  Administered 2018-12-14: 1500 mg via INTRAVENOUS
  Filled 2018-12-14: qty 1500

## 2018-12-14 MED ORDER — SODIUM CHLORIDE 0.9 % IV SOLN
200.0000 mg | Freq: Once | INTRAVENOUS | Status: AC
  Administered 2018-12-14: 200 mg via INTRAVENOUS
  Filled 2018-12-14: qty 20

## 2018-12-14 MED ORDER — VANCOMYCIN HCL IN DEXTROSE 750-5 MG/150ML-% IV SOLN
750.0000 mg | Freq: Two times a day (BID) | INTRAVENOUS | Status: DC
  Administered 2018-12-15 – 2018-12-18 (×7): 750 mg via INTRAVENOUS
  Filled 2018-12-14 (×7): qty 150

## 2018-12-14 MED ORDER — PANTOPRAZOLE SODIUM 40 MG PO PACK
40.0000 mg | PACK | Freq: Every day | ORAL | Status: DC
  Administered 2018-12-14 – 2018-12-29 (×16): 40 mg
  Filled 2018-12-14 (×16): qty 20

## 2018-12-14 MED ORDER — SODIUM CHLORIDE 0.9 % IV SOLN
2.0000 g | Freq: Two times a day (BID) | INTRAVENOUS | Status: DC
  Administered 2018-12-14 – 2018-12-17 (×7): 2 g via INTRAVENOUS
  Filled 2018-12-14 (×8): qty 20
  Filled 2018-12-14: qty 2
  Filled 2018-12-14: qty 20

## 2018-12-14 MED ORDER — DEXTROSE 5 % IV SOLN
700.0000 mg | Freq: Three times a day (TID) | INTRAVENOUS | Status: AC
  Administered 2018-12-14 – 2018-12-28 (×42): 700 mg via INTRAVENOUS
  Filled 2018-12-14 (×13): qty 14
  Filled 2018-12-14: qty 10
  Filled 2018-12-14 (×28): qty 14

## 2018-12-14 MED ORDER — SODIUM CHLORIDE 0.9 % IV SOLN
2.0000 g | INTRAVENOUS | Status: DC
  Administered 2018-12-14 – 2018-12-18 (×22): 2 g via INTRAVENOUS
  Filled 2018-12-14 (×30): qty 2000

## 2018-12-14 MED ORDER — LACTATED RINGERS IV SOLN
INTRAVENOUS | Status: DC
  Administered 2018-12-14 – 2018-12-16 (×3): via INTRAVENOUS

## 2018-12-14 MED ORDER — LIDOCAINE HCL (PF) 1 % IJ SOLN
INTRAMUSCULAR | Status: AC
  Filled 2018-12-14: qty 5

## 2018-12-14 MED ORDER — SODIUM CHLORIDE 0.9 % IV SOLN
1250.0000 mg | Freq: Two times a day (BID) | INTRAVENOUS | Status: DC
  Administered 2018-12-15 – 2018-12-26 (×23): 1250 mg via INTRAVENOUS
  Filled 2018-12-14 (×26): qty 12.5

## 2018-12-14 MED ORDER — SODIUM CHLORIDE 0.9 % IV SOLN
250.0000 mg | Freq: Once | INTRAVENOUS | Status: DC
  Filled 2018-12-14: qty 2.5

## 2018-12-14 MED ORDER — LEVETIRACETAM IN NACL 1500 MG/100ML IV SOLN
1500.0000 mg | Freq: Once | INTRAVENOUS | Status: AC
  Administered 2018-12-14: 1500 mg via INTRAVENOUS
  Filled 2018-12-14: qty 100

## 2018-12-14 MED ORDER — LIDOCAINE HCL (PF) 2 % IJ SOLN
2.0000 mL | Freq: Once | INTRAMUSCULAR | Status: DC
  Filled 2018-12-14: qty 2

## 2018-12-14 MED ORDER — SODIUM CHLORIDE 0.9 % IV SOLN
100.0000 mg | Freq: Two times a day (BID) | INTRAVENOUS | Status: DC
  Administered 2018-12-14: 100 mg via INTRAVENOUS
  Filled 2018-12-14 (×2): qty 10

## 2018-12-14 NOTE — Progress Notes (Signed)
Progress note  Spoke to the patient's son and informed him of the unsuccessful spinal tap at bedside. He reports that the patient has severe degenerative disc disease at L3-4 and L4-5.  He also says that the patient has been slowly deteriorating in terms of her functional status over the last few months requiring assistance for nearly all ADLs.  I told him that we have started the patient on HSV encephalitis coverage and bacterial meningitic coverage and she will be receiving a spinal tap under fluoroscopy guided-tomorrow.  Neurology will follow with you  Please call with questions  -- Amie Portland, MD [facility] Pager: [phone] If 7pm to 7am, please call on call as listed on AMION.

## 2018-12-14 NOTE — Procedures (Signed)
  Electroencephalogram report- LTM with VIDEO  Ordering Physician : kirkpatrick  Beginning time: 12/13/18 at 0800 Ending time:  12/14/18 at 0800 CPT/type : 95720  Day of study: day 2   Technical Description: The EEG was performed using standard setting per the guidelines of American Clinical Neurophysiology Society (ACNS).    A minimum of 21 electrodes were placed on scalp according to the International 10-20 or/and 10-10 Systems. Supplemental electrodes were placed as needed. Single EKG electrode was also used to detect cardiac arrhythmia. Patient's behavior was continuously recorded on video simultaneously with EEG. A minimum of 16 channels were used for data display. Each epoch of study was reviewed manually daily and as needed using standard referential and bipolar montages. Computerized quantitative EEG analysis (such as compressed spectral array analysis, trending, automated spike & seizure detection) were used as indicated.    Spike detection: ON  Seizure detection: ON   This  continuous  EEG monitoring with simultaneous video monitoring was performed for this patient with convulsions to rule out clinical and subclinical electrographic seizures.  Medications: As per EMR  Day 1 There was no pushbutton activations events during this recording.  Background activities marked by continuous reactive background activities with stage changes ranging between 4 cps to 7 cycles.  Faster frequencies better developed on the right as opposed to left.  Left PLED suggestive of cortical irritability present throughout the recording occur every 2 to 3 seconds with maximum negativity at F7. At times left frontotemporal sharp waves and spikes occur in short evolving trains.  No clinical subclinical seizures present  Day 2 : There was no pushbutton activations events during this recording.  Background activities marked by continuous reactive background activities with stage changes ranging between 4 cps  to 7 cycles.  Faster frequencies better developed on the right as opposed to left.  Left ant temporal spikes and left PED suggestive of cortical irritability present throughout the recording occur every 2 to 3 seconds with maximum negativity at F7. At times left frontotemporal sharp waves and spikes occur in short evolving trains 2-3 sec Several brief electrographic seizures arising from left ant temporal region .   Seizures: yes   Clinical interpretation: This day 2 of continuous EEG monitoring with simultaneous video monitoring is abnormal for several reasons.  #1 background activity slowing suggestive of mild encephalopathy of nonspecific etiologies #2 left hemispheric periodic lateralized epileptiform discharges suggestive of cortical irritability particularly in the left anterior frontotemporal region. #3. Several brief electrographic seizures arising from left ant temporal region were present.  Continuous  monitoring is recommended to ensure resolution of clinical and subclinical seizures.  Clinical correlation is advised.

## 2018-12-14 NOTE — Progress Notes (Signed)
LTM maint complete 

## 2018-12-14 NOTE — Progress Notes (Signed)
Patient's Daughter In Psychiatric nurse called to report that patient had been found down at home a couple of weeks ago and they suspect she had fallen and hit the back of her neck.

## 2018-12-14 NOTE — Progress Notes (Signed)
Per Neurology MD keep [place] flat until 1930

## 2018-12-14 NOTE — Progress Notes (Addendum)
Pharmacy Antibiotic Note  Holly Cooper is a 85 y.o. female admitted on 12/12/2018 with suspected meningitis. Pharmacy has been consulted for empiric antibiotic and antiviral dosing, including vancomycin, ceftriaxone, ampicillin, and acyclovir. Currently AF, WBC 13.2. Scr stable 0.83, estimated CrCl ~55 mL/min. Total body weight 80.5 kg, adjusted body weight 67.8 kg.  Plan: Vancomycin 1500mg  IV x1, followed by 750mg  IV q12h. Goal trough 15-20 mcg/mL. Ceftriaxone 2g IV q12h Ampicillin 2g IV q4h Acyclovir 700mg  (~10mg /kg AdjBW) q8h F/u clinical status, LP results, C&S, renal function, de-escalation, LOT, vancomycin levels as appropriate  Height: 5\' 6"  (167.6 cm)(from 11/14/18 encounter) Weight: 177 lb 7.5 oz (80.5 kg) IBW/kg (Calculated) : 59.3  Temp (24hrs), Avg:98.7 F (37.1 C), Min:98.1 F (36.7 C), Max:99.2 F (37.3 C)  Recent Labs  Lab 12/12/18 1822 12/13/18 0534 12/14/18 0945  WBC 12.7* 11.6* 13.2*  CREATININE 0.87 0.90 0.83    Estimated Creatinine Clearance: 55 mL/min (by C-G formula based on SCr of 0.83 mg/dL).    Allergies  Allergen Reactions  . Atorvastatin Other (See Comments)    Muscle aches - back    Antimicrobials this admission: Vanc 3/29 >> CTX 3/29 >> Ampicillin 3/29 >> Acyclovir 3/29 >>  Microbiology results: 3/27 MRSA PCR: negative  Thank you for allowing pharmacy to be a part of this patient's care.  [name], PharmD, [place] PGY2 Infectious Diseases Pharmacy Resident Phone: [phone] 12/14/2018 5:00 PM

## 2018-12-14 NOTE — Progress Notes (Signed)
NAME:  Holly Cooper, MRN:  [id number], DOB:  September 12, 1934, LOS: 2 ADMISSION DATE:  12/12/2018,  CHIEF COMPLAINT:  Seizures, Acute resp failure   Brief History   85 year old woman, history of CVA, history DVT/PE on Eliquis (question compliance). Experienced recurrent seizures that were treated initially with medazepam and then with Keppra.  She was intubated for failure to protect her airway.  Transfer is now to [facility] for further evaluation mechanically ventilated.   History of present illness   85 year old woman, history of CVA, history DVT/PE on Eliquis (question compliance). Last seen normal around 3 AM 3/27, then found at home the next morning with eye deviation and unresponsiveness.  She received Versed for seizure activity from EMS, was receiving bag mask ventilation on arrival to the emergency department.  She required endotracheal intubation.  She had another episode of right gaze preference and apparent seizure activity.  Received Keppra.  A CT scan of her head/CTA did not show any abnormality or acute CVA.  She again experienced similar seizure syndrome, received benzodiazepines and was transferred for further care.  Past Medical History  CVA DVT/ PE Hyperlipidemia obesity  Significant Hospital Events   3/28 - loaded with fosphenytoin   Consults:  Neurology  Procedures:  ETT 3/27 >>   Significant Diagnostic Tests:  Head CT/ CTA 3/27 >> no evidence CVA, no evidence of large vessel occlusion  Micro Data:    Antimicrobials:     Interim history/subjective:  No issues overnight.  More responsive this morning.  Following commands off sedation.  Able to tolerate SBT.  Discussed care with neurology at bedside still with some lateralizing discharges.  They would like to leave on continuous EEG and make medication changes.  Objective   Blood pressure (!) 124/55, pulse 63, temperature 98.1 F (36.7 C), temperature source Axillary, resp. rate 16, height 5\' 6"  (1.676 m), weight  80.5 kg, SpO2 100 %.    Vent Mode: CPAP;PSV FiO2 (%):  [40 %] 40 % Set Rate:  [14 bmp] 14 bmp Vt Set:  [450 mL] 450 mL PEEP:  [5 cmH20] 5 cmH20 Pressure Support:  [15 cmH20] 15 cmH20 Plateau Pressure:  [17 cmH20-18 cmH20] 17 cmH20   Intake/Output Summary (Last 24 hours) at 12/14/2018 0920 Last data filed at 12/14/2018 0900 Gross per 24 hour  Intake 1334.42 ml  Output 750 ml  Net 584.42 ml    Examination: General: Elderly female, intubated on mechanical ventilation following some basic commands HENT: Endotracheal tube in place, NCAT Lungs: Bilateral ventilated breath sounds Cardiovascular: Regular rate and rhythm, S1-S2, no MRG Abdomen: Soft, nontender, nondistended Extremities: No edema SCDs in place Neuro: Follows commands this morning loss of spontaneous movement attempts to pull the tube.  Still have to be placed in restraints.    Resolved Hospital Problem list     Assessment & Plan:   Acute encephalopathy  Status epilepticus history CVA/TIA, question medical compliance Continue Keppra per neurology, Dilantin per neurology The addition of Vimpat today per neurology Continuous EEG at the moment. They would like to keep this for the next 24 hours. Once the EEG is complete and will have an MRI.  All of this is currently being managed and arranged by neurology services Critical care appreciates the help with this.  Acute respiratory failure due to failure to protect airway, requiring intubation and mechanical ventilation At this time the patient will remain on mechanical ventilator until they are done with a continuous EEG and manipulation of the AEDs Continue PRVC  8 cc/kg Ventilator associated moaning prevention orders are in place.  History of DVT/PE Anticoagulation held due to potential risk for hemorrhagic conversion if there was an early stroke. This potentially can be restarted once MRI is complete per neurology  History hypertension Holding home  antihypertensive regimen at this time  Nutrition Tube feeds    Best practice:  Diet: TF start 3/28 Pain/Anxiety/Delirium protocol (if indicated): Intermittent Versed, fentanyl VAP protocol (if indicated): Ordered 3/27 DVT prophylaxis: SCD GI prophylaxis: Protonix Glucose control: NA Mobility: Bedrest Code Status: Full code Family Communication: No family present currently Disposition: ICU  Labs   CBC: Recent Labs  Lab 12/12/18 1744 12/12/18 1822 12/13/18 0534  WBC  --  12.7* 11.6*  NEUTROABS  --  9.5*  --   HGB 13.9 13.9 13.8  HCT 41.0 44.8 43.5  MCV  --  92.0 92.0  PLT  --  206 989    Basic Metabolic Panel: Recent Labs  Lab 12/12/18 1744 12/12/18 1822 12/13/18 0534 12/13/18 1018 12/13/18 1644 12/14/18 0407  NA 137 138 139  --   --   --   K 3.7 3.6 4.0  --   --   --   CL  --  100 102  --   --   --   CO2  --  25 23  --   --   --   GLUCOSE  --  115* 126*  --   --   --   BUN  --  11 12  --   --   --   CREATININE  --  0.87 0.90  --   --   --   CALCIUM  --  9.7 9.3  --   --   --   MG  --  2.0 2.0 2.0 2.0 2.1  PHOS  --  4.0 4.6 4.4 4.2 3.6   GFR: Estimated Creatinine Clearance: 50.7 mL/min (by C-G formula based on SCr of 0.9 mg/dL). Recent Labs  Lab 12/12/18 1822 12/13/18 0534  WBC 12.7* 11.6*    Liver Function Tests: Recent Labs  Lab 12/12/18 1822  AST 16  ALT 12  ALKPHOS 64  BILITOT 1.0  PROT 6.9  ALBUMIN 3.4*   No results for input(s): LIPASE, AMYLASE in the last 168 hours. No results for input(s): AMMONIA in the last 168 hours.   ABG    Component Value Date/Time   PHART 7.437 12/12/2018 1744   PCO2ART 43.0 12/12/2018 1744   PO2ART 135.0 (H) 12/12/2018 1744   HCO3 29.0 (H) 12/12/2018 1744   TCO2 30 12/12/2018 1744   O2SAT 99.0 12/12/2018 1744     Coagulation Profile: Recent Labs  Lab 12/12/18 1822  INR 1.4*    Cardiac Enzymes: No results for input(s): CKTOTAL, CKMB, CKMBINDEX, TROPONINI in the last 168 hours.  HbA1C: No  results found for: HGBA1C  CBG: Recent Labs  Lab 12/13/18 1524 12/13/18 1932 12/13/18 2353 12/14/18 0324 12/14/18 0734  GLUCAP 118* 110* 95 100* 97    This patient is critically ill with multiple organ system failure; which, requires frequent high complexity decision making, assessment, support, evaluation, and titration of therapies. This was completed through the application of advanced monitoring technologies and extensive interpretation of multiple databases. During this encounter critical care time was devoted to patient care services described in this note for 32 minutes.   Garner Nash, DO [facility] 12/14/2018 9:21 AM  Personal pager: [phone] If unanswered, please page CCM On-call: #  [phone]       

## 2018-12-14 NOTE — Procedures (Signed)
LUMBAR PUNCTURE (SPINAL TAP) PROCEDURE NOTE  Indication: AMS, Concern for HSV Encephalitis   Proceduralists: Amie Portland, MD, D. Aris Lot, NP   Risks of the procedure were dicussed with the patient's son over phone including post-LP headache, bleeding (On eliquis, last dose 2d ago atleast), infection, weakness/numbness of legs(radiculopathy), death.    Consent obtained from: Son - paul Bogg over phone   Procedure Note The patient was prepped and draped, and using sterile technique a 20 gauge quinke spinal needle was inserted in the L4-5 space.  Frank blood 1cc returned. Landmarks reassessed and tried in L3-4 space with hitting bone on all tries. Attempted 5 times at each level and aborted due to no CSF return.  No CSF return.  Will request IR for fluoro guided LP.  Patient tolerated the procedure well and blood loss was minimal - 4-5 cc.  -- Amie Portland, MD [facility] Pager: [phone] If 7pm to 7am, please call on call as listed on AMION.

## 2018-12-15 ENCOUNTER — Inpatient Hospital Stay (HOSPITAL_COMMUNITY): Payer: Medicare Other

## 2018-12-15 ENCOUNTER — Inpatient Hospital Stay: Payer: Self-pay

## 2018-12-15 DIAGNOSIS — J9600 Acute respiratory failure, unspecified whether with hypoxia or hypercapnia: Secondary | ICD-10-CM

## 2018-12-15 LAB — CBC
HCT: 34.8 % — ABNORMAL LOW (ref 36.0–46.0)
Hemoglobin: 10.8 g/dL — ABNORMAL LOW (ref 12.0–15.0)
MCH: 29 pg (ref 26.0–34.0)
MCHC: 31 g/dL (ref 30.0–36.0)
MCV: 93.3 fL (ref 80.0–100.0)
Platelets: 155 10*3/uL (ref 150–400)
RBC: 3.73 MIL/uL — ABNORMAL LOW (ref 3.87–5.11)
RDW: 16.9 % — ABNORMAL HIGH (ref 11.5–15.5)
WBC: 10.2 10*3/uL (ref 4.0–10.5)
nRBC: 0 % (ref 0.0–0.2)

## 2018-12-15 LAB — GLUCOSE, CAPILLARY
GLUCOSE-CAPILLARY: 98 mg/dL (ref 70–99)
GLUCOSE-CAPILLARY: 88 mg/dL (ref 70–99)
Glucose-Capillary: 104 mg/dL — ABNORMAL HIGH (ref 70–99)
Glucose-Capillary: 74 mg/dL (ref 70–99)
Glucose-Capillary: 102 mg/dL — ABNORMAL HIGH (ref 70–99)
Glucose-Capillary: 99 mg/dL (ref 70–99)

## 2018-12-15 LAB — PROTIME-INR
INR: 1.3 — ABNORMAL HIGH (ref 0.8–1.2)
Prothrombin Time: 16.3 seconds — ABNORMAL HIGH (ref 11.4–15.2)

## 2018-12-15 MED ORDER — SODIUM CHLORIDE 0.9% FLUSH
10.0000 mL | INTRAVENOUS | Status: DC | PRN
  Administered 2018-12-20: 10 mL
  Filled 2018-12-15: qty 40

## 2018-12-15 MED ORDER — CHLORHEXIDINE GLUCONATE CLOTH 2 % EX PADS: 6.0000 | MEDICATED_PAD | Freq: Every day | CUTANEOUS | Status: DC

## 2018-12-15 MED ORDER — CHLORHEXIDINE GLUCONATE CLOTH 2 % EX PADS
6.0000 | MEDICATED_PAD | Freq: Every day | CUTANEOUS | Status: DC
  Administered 2018-12-15 – 2018-12-30 (×15): 6 via TOPICAL

## 2018-12-15 MED ORDER — SODIUM CHLORIDE 0.9 % IV SOLN
200.0000 mg | Freq: Two times a day (BID) | INTRAVENOUS | Status: DC
  Administered 2018-12-15 – 2018-12-26 (×23): 200 mg via INTRAVENOUS
  Filled 2018-12-15 (×29): qty 20

## 2018-12-15 MED ORDER — SODIUM CHLORIDE 0.9% FLUSH
10.0000 mL | Freq: Two times a day (BID) | INTRAVENOUS | Status: DC
  Administered 2018-12-15 – 2018-12-18 (×4): 10 mL
  Administered 2018-12-18 (×2): 20 mL
  Administered 2018-12-19 – 2018-12-21 (×4): 10 mL

## 2018-12-15 NOTE — Progress Notes (Signed)
LTM maint complete - no skin breakdown . 

## 2018-12-15 NOTE — Progress Notes (Signed)
Peripherally Inserted Central Catheter/Midline Placement  The IV Nurse has discussed with the patient and/or persons authorized to consent for the patient, the purpose of this procedure and the potential benefits and risks involved with this procedure.  The benefits include less needle sticks, lab draws from the catheter, and the patient may be discharged home with the catheter. Risks include, but not limited to, infection, bleeding, blood clot (thrombus formation), and puncture of an artery; nerve damage and irregular heartbeat and possibility to perform a PICC exchange if needed/ordered by physician.  Alternatives to this procedure were also discussed.  Bard Power PICC patient education guide, fact sheet on infection prevention and patient information card has been provided to patient /or left at bedside.    PICC/Midline Placement Documentation  PICC Double Lumen 96/28/36 PICC Left Basilic 40 cm 0 cm (Active)  Indication for Insertion or Continuance of Line Prolonged intravenous therapies;Poor Vasculature-patient has had multiple peripheral attempts or PIVs lasting less than 24 hours;Limited venous access - need for IV therapy >5 days (PICC only);Vasoactive infusions 12/15/2018  2:52 PM  Exposed Catheter (cm) 0 cm 12/15/2018  2:52 PM  Site Assessment Clean;Dry;Intact 12/15/2018  2:52 PM  Lumen #1 Status Flushed;Saline locked;Blood return noted 12/15/2018  2:52 PM  Lumen #2 Status Flushed;Saline locked;Blood return noted 12/15/2018  2:52 PM  Dressing Type Transparent 12/15/2018  2:52 PM  Dressing Status Clean;Dry;Intact;Antimicrobial disc in place 12/15/2018  2:52 PM  Dressing Change Due 12/22/18 12/15/2018  2:52 PM       Gordan Payment 12/15/2018, 2:53 PM

## 2018-12-15 NOTE — Progress Notes (Signed)
Subjective: LTM EEG is running.   Objective: Current vital signs: BP (!) 99/56   Pulse 68   Temp 97.9 F (36.6 C) (Axillary)   Resp 14   Ht 5' 6"  (1.676 m) Comment: from 11/14/18 encounter  Wt 82.6 kg   SpO2 100%   BMI 29.39 kg/m  Vital signs in last 24 hours: Temp:  [97.9 F (36.6 C)-99.9 F (37.7 C)] 97.9 F (36.6 C) (03/30 0800) Pulse Rate:  [59-91] 68 (03/30 0804) Resp:  [13-31] 14 (03/30 0804) BP: (73-127)/(47-85) 99/56 (03/30 0804) SpO2:  [85 %-100 %] 100 % (03/30 0805) FiO2 (%):  [30 %-40 %] 30 % (03/30 0805) Weight:  [82.6 kg] 82.6 kg (03/30 0500)  Intake/Output from previous day: 03/29 0701 - 03/30 0700 In: 3054 [I.V.:883.7; NG/GT:540; IV Piggyback:1630.3] Out: 1600 [Urine:1600] Intake/Output this shift: Total I/O In: 102.7 [I.V.:62; IV Piggyback:40.7] Out: -  Nutritional status:  Diet Order            Diet NPO time specified  Diet effective now              Neurologic Exam: Ment: Intubated. Eyes closed; will close eyelids tightly and furrow brow to noxious. Semipurposeful movement of all 4 extremities to noxious stimuli. Does not spontaneously open eyes or follow commands.  CN: Pupils 2 mm and briskly reactive bilaterally. Eyes conjugately at the midline with suppressed oculocephalic reflex. No nystagmus.  Motor/Sensory: Semipurposeful movement of upper and lower extremities to noxious stimuli without asymmetry.  Reflexes: Brisk, low amplitude brachioradialis and patellar reflexes bilaterally.   Lab Results: Results for orders placed or performed during the hospital encounter of 12/12/18 (from the past 48 hour(s))  Magnesium     Status: None   Collection Time: 12/13/18 10:18 AM  Result Value Ref Range   Magnesium 2.0 1.7 - 2.4 mg/dL    Comment: Performed at [facility], [place] [address], [place], [place] [address]  Phosphorus     Status: None   Collection Time: 12/13/18 10:18 AM  Result Value Ref Range   Phosphorus 4.4 2.5 - 4.6 mg/dL     Comment: Performed at [facility] [address] [place], [place] [address]  Glucose, capillary     Status: None   Collection Time: 12/13/18 11:24 AM  Result Value Ref Range   Glucose-Capillary 96 70 - 99 mg/dL   Comment 1 Notify RN    Comment 2 Document in Chart   Glucose, capillary     Status: Abnormal   Collection Time: 12/13/18  3:24 PM  Result Value Ref Range   Glucose-Capillary 118 (H) 70 - 99 mg/dL   Comment 1 Notify RN    Comment 2 Document in Chart   Magnesium     Status: None   Collection Time: 12/13/18  4:44 PM  Result Value Ref Range   Magnesium 2.0 1.7 - 2.4 mg/dL    Comment: Performed at [facility], [place] [address] [place], [place] [address]  Phosphorus     Status: None   Collection Time: 12/13/18  4:44 PM  Result Value Ref Range   Phosphorus 4.2 2.5 - 4.6 mg/dL    Comment: Performed at [facility] [address], [place], [place] [address]  Glucose, capillary     Status: Abnormal   Collection Time: 12/13/18  7:32 PM  Result Value Ref Range   Glucose-Capillary 110 (H) 70 - 99 mg/dL  Glucose, capillary     Status: None  Collection Time: 12/13/18 11:53 PM  Result Value Ref Range   Glucose-Capillary 95 70 - 99 mg/dL  Glucose, capillary     Status: Abnormal   Collection Time: 12/14/18  3:24 AM  Result Value Ref Range   Glucose-Capillary 100 (H) 70 - 99 mg/dL  Magnesium     Status: None   Collection Time: 12/14/18  4:07 AM  Result Value Ref Range   Magnesium 2.1 1.7 - 2.4 mg/dL    Comment: Performed at [facility], [place] [address], [place], [place] [address]  Phosphorus     Status: None   Collection Time: 12/14/18  4:07 AM  Result Value Ref Range   Phosphorus 3.6 2.5 - 4.6 mg/dL    Comment: Performed at [facility] [address] [place], [place] [address]  Glucose, capillary     Status: None   Collection Time: 12/14/18  7:34 AM  Result Value Ref Range   Glucose-Capillary 97 70 - 99 mg/dL   Comment 1 Notify RN     Comment 2 Document in Chart   CBC with Differential/Platelet     Status: Abnormal   Collection Time: 12/14/18  9:45 AM  Result Value Ref Range   WBC 13.2 (H) 4.0 - 10.5 K/uL   RBC 4.44 3.87 - 5.11 MIL/uL   Hemoglobin 12.8 12.0 - 15.0 g/dL   HCT 41.1 36.0 - 46.0 %   MCV 92.6 80.0 - 100.0 fL   MCH 28.8 26.0 - 34.0 pg   MCHC 31.1 30.0 - 36.0 g/dL   RDW 16.8 (H) 11.5 - 15.5 %   Platelets 189 150 - 400 K/uL   nRBC 0.0 0.0 - 0.2 %   Neutrophils Relative % 79 %   Neutro Abs 10.4 (H) 1.7 - 7.7 K/uL   Lymphocytes Relative 12 %   Lymphs Abs 1.5 0.7 - 4.0 K/uL   Monocytes Relative 7 %   Monocytes Absolute 0.9 0.1 - 1.0 K/uL   Eosinophils Relative 2 %   Eosinophils Absolute 0.2 0.0 - 0.5 K/uL   Basophils Relative 0 %   Basophils Absolute 0.0 0.0 - 0.1 K/uL   Immature Granulocytes 0 %   Abs Immature Granulocytes 0.04 0.00 - 0.07 K/uL    Comment: Performed at [facility], [place] [address] [place], [place] [address]  Basic metabolic panel     Status: Abnormal   Collection Time: 12/14/18  9:45 AM  Result Value Ref Range   Sodium 137 135 - 145 mmol/L   Potassium 3.6 3.5 - 5.1 mmol/L   Chloride 105 98 - 111 mmol/L   CO2 25 22 - 32 mmol/L   Glucose, Bld 122 (H) 70 - 99 mg/dL   BUN 25 (H) 8 - 23 mg/dL   Creatinine, Ser 0.83 0.44 - 1.00 mg/dL   Calcium 8.7 (L) 8.9 - 10.3 mg/dL   GFR calc non Af Amer >60 >60 mL/min   GFR calc Af Amer >60 >60 mL/min   Anion gap 7 5 - 15    Comment: Performed at [facility], [place] [address] [place], [place] [address]  Phenytoin level, total     Status: None   Collection Time: 12/14/18  9:45 AM  Result Value Ref Range   Phenytoin Lvl 16.7 10.0 - 20.0 ug/mL    Comment: Performed at [facility] [address] [place], [place] [address]  Glucose, capillary     Status: Abnormal   Collection Time:  12/14/18 11:45 AM  Result Value Ref Range   Glucose-Capillary 103 (H) 70 - 99 mg/dL   Comment 1 Notify RN    Comment 2 Document in Chart    Glucose, capillary     Status: None   Collection Time: 12/14/18  3:39 PM  Result Value Ref Range   Glucose-Capillary 97 70 - 99 mg/dL   Comment 1 Notify RN    Comment 2 Document in Chart   Glucose, capillary     Status: None   Collection Time: 12/14/18  7:34 PM  Result Value Ref Range   Glucose-Capillary 87 70 - 99 mg/dL  Glucose, capillary     Status: None   Collection Time: 12/14/18 11:17 PM  Result Value Ref Range   Glucose-Capillary 99 70 - 99 mg/dL  Glucose, capillary     Status: Abnormal   Collection Time: 12/15/18  3:20 AM  Result Value Ref Range   Glucose-Capillary 104 (H) 70 - 99 mg/dL  Glucose, capillary     Status: None   Collection Time: 12/15/18  8:05 AM  Result Value Ref Range   Glucose-Capillary 98 70 - 99 mg/dL   Comment 1 Notify RN    Comment 2 Document in Chart     Recent Results (from the past 240 hour(s))  MRSA PCR Screening     Status: None   Collection Time: 12/12/18  5:52 PM  Result Value Ref Range Status   MRSA by PCR NEGATIVE NEGATIVE Final    Comment:        The GeneXpert MRSA Assay (FDA approved for NASAL specimens only), is one component of a comprehensive MRSA colonization surveillance program. It is not intended to diagnose MRSA infection nor to guide or monitor treatment for MRSA infections. Performed at [facility], [place] [address], [place], [place] [address]     Lipid Panel No results for input(s): CHOL, TRIG, HDL, CHOLHDL, VLDL, LDLCALC in the last 72 hours.  Studies/Results: [employer]  Result Date: 12/15/2018 If [employer] not attached, placement could not be confirmed due to current cardiac rhythm.   Medications:  Scheduled: . chlorhexidine gluconate (MEDLINE KIT)  15 mL Mouth Rinse BID  . feeding supplement (PRO-STAT SUGAR FREE 64)  60 mL Per Tube Daily  . feeding supplement (VITAL HIGH PROTEIN)  1,000 mL Per Tube Q24H  . mouth rinse  15 mL Mouth Rinse 10 times per day  . pantoprazole sodium  40 mg  Per Tube Daily  . phenytoin (DILANTIN) IV  100 mg Intravenous Q8H   Continuous: . sodium chloride Stopped (12/14/18 1601)  . acyclovir Stopped (12/15/18 6962)  . ampicillin (OMNIPEN) IV 2 g (12/15/18 0901)  . cefTRIAXone (ROCEPHIN)  IV Stopped (12/14/18 1834)  . lacosamide (VIMPAT) IV Stopped (12/14/18 2213)  . lactated ringers 60 mL/hr at 12/15/18 0800  . levETIRAcetam Stopped (12/15/18 0523)  . midazolam 2 mg/hr (12/15/18 0800)  . vancomycin Stopped (12/15/18 9528)     Assessment: 85 year old female with new onset seizures in the setting of an old stroke. On initial evaluation she was noted to be actively seizing on the right with rightward gaze deviation. Initial presentation was classifiable as status epilepticus, which is now resolved. She is status post intubation due to respiratory failure. Also has a history of PE and DVT, on Eliquis as an outpatient. 1. Status epilepticus, resolved. On LTM EEG - report from this AM documents resolution of seizures. However, her EEG is abnormal for several  reasons.  #1 background activity slowing suggestive of mild encephalopathy of nonspecific etiologies. #2 left hemispheric periodic lateralized epileptiform discharges suggestive of cortical irritability particularly in the left anterior frontotemporal region.  2. Her new onset seizures are felt most likely to be secondary to the old stroke 3. Currently on Vimpat 100 mg BID, Keppra 1250 mg BID and phenytoin 100 mg TID. Phenytoin level was 16.7 yesterday.  4. Of note, per family the patient has been slowly deteriorating in terms of her functional status over the last few months requiring assistance for nearly all ADLs.  Recommendations:  1. Maintain seizure precautions 2. Vent management per primary team-appreciate their assistance in management of the patient. 3. Consider MRI of the brain once the EEG leads are off and there is no concern for ongoing status/electrographic disturbance.  4. Will need  LP under fluoro. Initial attempt unsuccessful. Eliquis on hold - can be resumed once LP is done 5. 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 We have started the patient on HSV encephalitis coverage and bacterial meningitic coverage (acyclovir, ampicillin, ceftriaxone and vancomycin).  6. Continue LTM. Will increase Vimpat to 200 mg BID and reassess EEG tomorrow EEG for possible resolution of LPDs. If EEG is improved, will discontinue low-dose Versed gtt.  7. Treatment regimen to be modified or discontinued pending results of CSF analysis. Fluoro-guided LP pending for today.  8. Coags have been drawn in anticipation of LP and the patient is NPO.   35 minutes spent in the neurological evaluation and management of this critically ill patient.    LOS: 3 days   @Electronically  signed: Dr. Kerney Elbe 12/15/2018  9:35 AM

## 2018-12-15 NOTE — Progress Notes (Signed)
Spoke with IR. They wont be able to do LP on vented pt. Will cancel IR LP order. Will cancel CSF orders Continue to empirically treat for HSV enceph and also cover for bacterial mening.  Neurology to continue to follow   -- Amie [place], MD [facility] Pager: [phone] If 7pm to 7am, please call on call as listed on AMION.

## 2018-12-15 NOTE — Procedures (Signed)
  Electroencephalogram report- LTM with VIDEO  Ordering Physician : [email or web address]  Beginning time: 12/14/18 at 0800 Ending time:  12/15/18 at 0800 CPT/type : 95720  Day of study: day 3   Technical Description: The EEG was performed using standard setting per the guidelines of American Clinical Neurophysiology Society (ACNS).    A minimum of 21 electrodes were placed on scalp according to the International 10-20 or/and 10-10 Systems. Supplemental electrodes were placed as needed. Single EKG electrode was also used to detect cardiac arrhythmia. Patient's behavior was continuously recorded on video simultaneously with EEG. A minimum of 16 channels were used for data display. Each epoch of study was reviewed manually daily and as needed using standard referential and bipolar montages. Computerized quantitative EEG analysis (such as compressed spectral array analysis, trending, automated spike & seizure detection) were used as indicated.    Spike detection: ON  Seizure detection: ON   This  continuous  EEG monitoring with simultaneous video monitoring was performed for this patient with convulsions to rule out clinical and subclinical electrographic seizures.  Medications: As per EMR  Day 1 There was no pushbutton activations events during this recording.  Background activities marked by continuous reactive background activities with stage changes ranging between 4 cps to 7 cycles.  Faster frequencies better developed on the right as opposed to left.  Left PLED suggestive of cortical irritability present throughout the recording occur every 2 to 3 seconds with maximum negativity at F7. At times left frontotemporal sharp waves and spikes occur in short evolving trains.  No clinical subclinical seizures present  Day 2 : There was no pushbutton activations events during this recording.  Background activities marked by continuous reactive background activities with stage changes ranging between 4 cps  to 7 cycles.  Faster frequencies better developed on the right as opposed to left.  Left ant temporal spikes and left PED suggestive of cortical irritability present throughout the recording occur every 2 to 3 seconds with maximum negativity at F7. At times left frontotemporal sharp waves and spikes occur in short evolving trains 2-3 sec Several brief electrographic seizures arising from left ant temporal region .   Seizures: yes   Day 3: There was no pushbutton activations events during this recording.  Background activities marked by continuous reactive background activities with stage changes ranging .   Left ant temporal spikes and left PED suggestive of cortical irritability present throughout the recording occur every 2 to 3 seconds with maximum negativity at F7. Seizures: no   Clinical interpretation: This day 3 of continuous EEG monitoring with simultaneous video monitoring did not demonstrate any seizures. EEG is abnormal for several reasons.  #1 background activity slowing suggestive of mild encephalopathy of nonspecific etiologies #2 left hemispheric periodic lateralized epileptiform discharges suggestive of cortical irritability particularly in the left anterior frontotemporal region.  Continuous  monitoring is recommended to ensure resolution of clinical and subclinical seizures.  Clinical correlation is advised.

## 2018-12-15 NOTE — Progress Notes (Signed)
NAME:  Holly Cooper, MRN:  [id number], DOB:  1933-12-18, LOS: 3 ADMISSION DATE:  12/12/2018,  CHIEF COMPLAINT:  Seizures, Acute resp failure   Brief History   85 year old woman, history of CVA, history DVT/PE on Eliquis (question compliance). Experienced recurrent seizures that were treated initially with medazepam and then with Keppra.  She was intubated for failure to protect her airway.  Transfer is now to [facility] for further evaluation mechanically ventilated.    Past Medical History  CVA,DVT/ PE,Hyperlipidemia,obesity  Significant Hospital Events   3/28 - loaded with fosphenytoin  3/29 following commands off sedation. Cont EEG continued. Unable to get LP. Started on empiric coverage for HSV encephalitis as well as bacterial coverage  Consults:  Neurology  Procedures:  ETT 3/27 >>   Significant Diagnostic Tests:  Head CT/ CTA 3/27 >> no evidence CVA, no evidence of large vessel occlusion  Micro Data:    Antimicrobials:  vanc 3/29>>> CTX 3/29>>> Amp 3/29>>> Acyclovir 3/29>>>  Interim history/subjective:  Sedated.  Will localize, grimaces to pain  Objective   Blood pressure (Abnormal) 99/56, pulse 68, temperature 97.9 F (36.6 C), temperature source Axillary, resp. rate 14, height 5\' 6"  (1.676 m), weight 82.6 kg, SpO2 100 %.    Vent Mode: PRVC FiO2 (%):  [30 %-40 %] 30 % Set Rate:  [14 bmp] 14 bmp Vt Set:  [450 mL] 450 mL PEEP:  [5 cmH20] 5 cmH20 Plateau Pressure:  [16 cmH20-20 cmH20] 18 cmH20   Intake/Output Summary (Last 24 hours) at 12/15/2018 0909 Last data filed at 12/15/2018 0800 Gross per 24 hour  Intake 3046.75 ml  Output 1500 ml  Net 1546.75 ml    Examination:  General: Elderly female currently sedated on Versed infusion HEENT: Normocephalic atraumatic no jugular venous distention orally intubated Pulmonary: Clear to auscultation no accessory use equal chest rise on mechanical assisted breath Cardiac: Regular rate and rhythm.  Does have 3 out of 6  systolic murmur consistent with aortic stenosis which radiates to the left carotid Abdomen: Soft nontender no organomegaly Extremities: Warm and dry Neuro: Grimaces to noxious stimulus, briskly localizes left faster than right GU: Clear yellow  Resolved Hospital Problem list     Assessment & Plan:   Acute encephalopathy  Status epilepticus Etiology unclear.  Currently there is concern for herpes encephalitis versus CNS infection/meningitis history CVA/TIA, question medical compliance Plan Continuous EEG per neurology  Anti-epileptic drugs per neurology  Day #2 ceftriaxone, Vanco, ampicillin, and acyclovir  Await lumbar puncture, this is been ordered under fluoroscopy after an unsuccessful attempt at bedside  Acute respiratory failure due to failure to protect airway, requiring intubation and mechanical ventilation Portable chest x-ray personally reviewed: Demonstrates endotracheal tube to be in satisfactory position.  NG tube courses below the diaphragm.  This is a low volume film, there is left basilar volume loss Plan Continue full ventilator support VAP bundle Awaiting follow-up with neurology in regards to cessation of seizure activity Assess for weaning after LP completed and no longer seizing  Mild leukocytosis Suspect reactive, no significant fevers Plan Trend CBC  History of DVT/PE Anticoagulation held due to potential risk for hemorrhagic conversion if there was an early stroke. Plan  Resume after lumbar puncture and when okay with neurology   History hypertension Plan Holding home antihypertensives for now  Nutrition Plan Tube feeds  Best practice:  Diet: TF start 3/28 Pain/Anxiety/Delirium protocol (if indicated): Intermittent Versed, fentanyl VAP protocol (if indicated): Ordered 3/27 DVT prophylaxis: SCD GI prophylaxis: Protonix Glucose  control: NA Mobility: Bedrest Code Status: Full code Family Communication: No family present currently  Disposition: Mains critically ill due to need for mechanical ventilation, need for titration of ventilatory support, need for titration of anticonvulsants and awaiting further evaluation for ruling out CSF infection.  Erick Colace ACNP-BC [place] Pager # [phone] OR # [phone] if no answer

## 2018-12-15 NOTE — Progress Notes (Signed)
Attempted calling family for PICC consent,unable to get hold of son.I will try again later.

## 2018-12-15 NOTE — Plan of Care (Signed)
  Problem: Pain Managment: Goal: General experience of comfort will improve Outcome: Progressing   

## 2018-12-16 ENCOUNTER — Inpatient Hospital Stay (HOSPITAL_COMMUNITY): Payer: Medicare Other

## 2018-12-16 LAB — CBC
HCT: 31.8 % — ABNORMAL LOW (ref 36.0–46.0)
Hemoglobin: 10.1 g/dL — ABNORMAL LOW (ref 12.0–15.0)
MCH: 30.1 pg (ref 26.0–34.0)
MCHC: 31.8 g/dL (ref 30.0–36.0)
MCV: 94.6 fL (ref 80.0–100.0)
Platelets: 140 10*3/uL — ABNORMAL LOW (ref 150–400)
RBC: 3.36 MIL/uL — ABNORMAL LOW (ref 3.87–5.11)
RDW: 17 % — ABNORMAL HIGH (ref 11.5–15.5)
WBC: 8.1 10*3/uL (ref 4.0–10.5)
nRBC: 0 % (ref 0.0–0.2)

## 2018-12-16 LAB — COMPREHENSIVE METABOLIC PANEL
ALT: 10 U/L (ref 0–44)
AST: 15 U/L (ref 15–41)
Albumin: 2 g/dL — ABNORMAL LOW (ref 3.5–5.0)
Alkaline Phosphatase: 35 U/L — ABNORMAL LOW (ref 38–126)
Anion gap: 8 (ref 5–15)
BUN: 24 mg/dL — ABNORMAL HIGH (ref 8–23)
CO2: 26 mmol/L (ref 22–32)
Calcium: 8.4 mg/dL — ABNORMAL LOW (ref 8.9–10.3)
Chloride: 107 mmol/L (ref 98–111)
Creatinine, Ser: 0.72 mg/dL (ref 0.44–1.00)
GFR calc Af Amer: >60 mL/min (ref >60)
GFR calc non Af Amer: >60 mL/min (ref >60)
Glucose, Bld: 109 mg/dL — ABNORMAL HIGH (ref 70–99)
Potassium: 2.9 mmol/L — ABNORMAL LOW (ref 3.5–5.1)
Sodium: 141 mmol/L (ref 135–145)
TOTAL PROTEIN: 4.8 g/dL — AB (ref 6.5–8.1)
Total Bilirubin: 0.4 mg/dL (ref 0.3–1.2)

## 2018-12-16 LAB — GLUCOSE, CAPILLARY
GLUCOSE-CAPILLARY: 98 mg/dL (ref 70–99)
Glucose-Capillary: 105 mg/dL — ABNORMAL HIGH (ref 70–99)
Glucose-Capillary: 90 mg/dL (ref 70–99)
Glucose-Capillary: 101 mg/dL — ABNORMAL HIGH (ref 70–99)
Glucose-Capillary: 99 mg/dL (ref 70–99)
Glucose-Capillary: 106 mg/dL — ABNORMAL HIGH (ref 70–99)

## 2018-12-16 MED ORDER — POTASSIUM CHLORIDE 20 MEQ/15ML (10%) PO SOLN
40.0000 meq | ORAL | Status: AC
  Administered 2018-12-16 (×2): 40 meq
  Filled 2018-12-16 (×2): qty 30

## 2018-12-16 MED ORDER — ENOXAPARIN SODIUM 100 MG/ML ~~LOC~~ SOLN
1.0000 mg/kg | Freq: Once | SUBCUTANEOUS | Status: AC
  Administered 2018-12-16: 85 mg via SUBCUTANEOUS
  Filled 2018-12-16: qty 0.85

## 2018-12-16 MED ORDER — HEPARIN (PORCINE) 25000 UT/250ML-% IV SOLN
1200.0000 [IU]/h | INTRAVENOUS | Status: DC
  Administered 2018-12-16: 1200 [IU]/h via INTRAVENOUS
  Filled 2018-12-16: qty 250

## 2018-12-16 MED ORDER — ENOXAPARIN SODIUM 100 MG/ML ~~LOC~~ SOLN
1.0000 mg/kg | Freq: Two times a day (BID) | SUBCUTANEOUS | Status: DC
  Administered 2018-12-17 – 2018-12-18 (×3): 85 mg via SUBCUTANEOUS
  Filled 2018-12-16 (×3): qty 0.85

## 2018-12-16 NOTE — Progress Notes (Signed)
NAME:  Holly Cooper, MRN:  [id number], DOB:  [birth year], LOS: 4 ADMISSION DATE:  12/12/2018,  CHIEF COMPLAINT:  Seizures, Acute resp failure   Brief History   85 year old woman, history of CVA, history DVT/PE on Eliquis (question compliance). Experienced recurrent seizures that were treated initially with medazepam and then with Keppra.  She was intubated for failure to protect her airway.  Transfer is now to [facility] for further evaluation mechanically ventilated.    Past Medical History  CVA,DVT/ PE,Hyperlipidemia,obesity  Significant Hospital Events   3/28 - loaded with fosphenytoin  3/29 following commands off sedation. Cont EEG continued. Unable to get LP. Started on empiric coverage for HSV encephalitis as well as bacterial coverage 3/30: Interventional radiology unable to do lumbar puncture due to inability lay patient in the prone position 3/31: Hemodynamically stable.  No seizures overnight.  Discontinuing Versed infusion Consults:  Neurology  Procedures:  ETT 3/27 >>   Significant Diagnostic Tests:  Head CT/ CTA 3/27 >> no evidence CVA, no evidence of large vessel occlusion  Micro Data:    Antimicrobials:  vanc 3/29>>> CTX 3/29>>> Amp 3/29>>> Acyclovir 3/29>>>  Interim history/subjective:  Sedated.  Will localize, grimaces to pain  Objective   Blood pressure 127/62, pulse 67, temperature 97.9 F (36.6 C), temperature source Axillary, resp. rate 15, height 5\' 6"  (1.676 m), weight 85.8 kg, SpO2 100 %.    Vent Mode: PRVC FiO2 (%):  [30 %] 30 % Set Rate:  [14 bmp] 14 bmp Vt Set:  [450 mL-470 mL] 470 mL PEEP:  [5 cmH20] 5 cmH20 Plateau Pressure:  [17 cmH20-19 cmH20] 17 cmH20   Intake/Output Summary (Last 24 hours) at 12/16/2018 3329 Last data filed at 12/16/2018 0900 Gross per 24 hour  Intake 4522.89 ml  Output 1350 ml  Net 3172.89 ml    Examination:  General: This is an 85 year old female patient who remains heavily sedated on mechanical assisted breathing  HEENT normocephalic atraumatic orally intubated no jugular venous distention Pulmonary: Clear to auscultation without accessory use equal chest rise on mechanically assisted ventilation Cardiac: Regular rate regular rhythm 3 out of 6 systolic murmur consistent with aortic stenosis this does radiate to the left carotid Abdomen: Soft, not tender no organomegaly tolerating tube feeds Extremities: Scattered areas of ecchymosis no significant edema strong pulses Neuro: Withdraws to pain, moves all extremities.  Cannot get her to follow commands Resolved Hospital Problem list     Assessment & Plan:   Acute encephalopathy  Status epilepticus Etiology unclear.  Currently there is concern for herpes encephalitis versus CNS infection/meningitis, given EEG findings herpes encephalitis being highest on differential diagnosis history CVA/TIA, question medical compliance Plan Discontinuing Versed drip  Continue antiepileptic drugs per neurology  Day #3 acyclovir  Day #3 vancomycin, ampicillin and ceftriaxone  We will continue current above antimicrobials empirically, unfortunately not able to get lumbar puncture as cannot lay her prone  Post extubation can get MRI  Can likely discontinue antibiotics and treat empirically with acyclovir but will discuss this further with neurology    Acute respiratory failure due to failure to protect airway, requiring intubation and mechanical ventilation Portable chest x-ray personally reviewed:This demonstrates the endotracheal tube to be in satisfactory position as is the left PICC line.  There continues to be left greater than right atelectasis, cannot exclude infiltrate Plan Continuing full ventilatory support Initiate spontaneous breathing trial once mental status will allow VAP bundle Intermittent chest x-rays  Mild leukocytosis Suspect reactive, white blood cell count is improved  Plan Continue to trend CBC  Fluid and electrolyte imbalance:  Hypokalemia Plan Replace and recheck  History of DVT/PE Anticoagulation held due to potential risk for hemorrhagic conversion if there was an early stroke. Plan  We will place her on IV heparin, likely resume Eliquis assuming no future invasive studies required  History hypertension Plan Holding home antihypertensives  Nutrition Plan Continue tube feeds  Best practice:  Diet: TF start 3/28 Pain/Anxiety/Delirium protocol (if indicated): Intermittent Versed, fentanyl VAP protocol (if indicated): Ordered 3/27 DVT prophylaxis: SCD GI prophylaxis: Protonix Glucose control: NA Mobility: Bedrest Code Status: Full code Family Communication: No family present currently Disposition: She remains critically ill due to her need for mechanical ventilation, frequent assessment and titration of ventilatory support in anticipation of weaning.  She remains on LTM, however hopefully this can be discontinued soon.  1 of the major issues at this point is been inability to get fluoroscopy guided LP.  For now we will continue to empirically treat with presumptive diagnosis of herpes encephalitis.  As there is no seizures will stop Versed infusion.  Erick Colace ACNP-BC [place] Pager # [phone] OR # [phone] if no answer

## 2018-12-16 NOTE — Progress Notes (Signed)
ANTICOAGULATION CONSULT NOTE - Initial Consult  Pharmacy Consult for heparin>>Lovenox Indication: hx VTE and stroke  Allergies  Allergen Reactions  . Atorvastatin Other (See Comments)    Muscle aches - back     Patient Measurements: Height: 5\' 6"  (167.6 cm)(from 11/14/18 encounter) Weight: 189 lb 2.5 oz (85.8 kg) IBW/kg (Calculated) : 59.3 Heparin Dosing Weight: 78 kg  Vital Signs: Temp: 99.9 F (37.7 C) (03/31 1600) Temp Source: Axillary (03/31 1600) BP: 122/80 (03/31 1600) Pulse Rate: 88 (03/31 1600)  Labs: Recent Labs    12/14/18 0945 12/15/18 1052 12/16/18 0546  HGB 12.8 10.8* 10.1*  HCT 41.1 34.8* 31.8*  PLT 189 155 140*  LABPROT  --  16.3*  --   INR  --  1.3*  --   CREATININE 0.83  --  0.72    Estimated Creatinine Clearance: 58.8 mL/min (by C-G formula based on SCr of 0.72 mg/dL).   Medical History: No past medical history on file.  Medications:  Medications Prior to Admission  Medication Sig Dispense Refill Last Dose  . lisinopril-hydrochlorothiazide (PRINZIDE,ZESTORETIC) 20-25 MG tablet Take 1 tablet by mouth daily.   unknown  . ondansetron (ZOFRAN) 8 MG tablet Take 8 mg by mouth every 8 (eight) hours as needed for nausea or vomiting.   unknown  . warfarin (COUMADIN) 1 MG tablet Take 0.5 mg by mouth daily. Take 1/2 tablet (0.5 mg) by mouth with a 2 mg tablets for a total daily dose of 2.5 mg   unknown  . warfarin (COUMADIN) 2 MG tablet Take 2 mg by mouth daily. Take 1 tablet (2 mg) with 1/2 1 mg tablet (0.5 mg) for a total daily dose of 2.5 mg   unknown    Assessment: 85 y/o female admitted 12/12/2018 for seizure and respiratory failure. She took warfarin PTA for hx VTE and stroke with questionable compliance; it was noted she was on apixaban PTA however refill records indicate she was on warfarin. INR on admit was subtherapeutic at 1.4, last INR was 1.3 on 3/30. Warfarin was initially held for LP however this was unable to be done. Pharmacy consulted to  begin IV heparin while off oral anticoagulation.  No bleeding noted, Hgb trended down to 10s, platelets trended down to 140 (206 on admit). Renal function is normal.  D/w Dr. Cheral Marker this PM to change pt to SQ lovenox instead of using IV heparin since the anticoag is more for the DVT/PE hx. He is ok with the change.   Goal of Therapy:  Anti-Xa 0.6-1 Monitor platelets by anticoagulation protocol: Yes   Plan:   Dc heparin Lovenox 85mg  SQ q12 Cont CBC monitoring Monitor for s/sx of bleeding F/U restart of warfarin  Onnie Boer, PharmD, BCIDP, AAHIVP, CPP Infectious Disease Pharmacist 12/16/2018 4:14 PM

## 2018-12-16 NOTE — Procedures (Signed)
  Electroencephalogram report- LTM with VIDEO  Ordering Physician : kirkpatrick  Beginning time: 12/15/18 at 0800 Ending time:  12/16/18 at 0800 CPT/type : 95720  Day of study: day 4   Technical Description: The EEG was performed using standard setting per the guidelines of American Clinical Neurophysiology Society (ACNS).    A minimum of 21 electrodes were placed on scalp according to the International 10-20 or/and 10-10 Systems. Supplemental electrodes were placed as needed. Single EKG electrode was also used to detect cardiac arrhythmia. Patient's behavior was continuously recorded on video simultaneously with EEG. A minimum of 16 channels were used for data display. Each epoch of study was reviewed manually daily and as needed using standard referential and bipolar montages. Computerized quantitative EEG analysis (such as compressed spectral array analysis, trending, automated spike & seizure detection) were used as indicated.    Spike detection: ON  Seizure detection: ON   This  continuous  EEG monitoring with simultaneous video monitoring was performed for this patient with convulsions to rule out clinical and subclinical electrographic seizures.  Medications: As per EMR  Day 1 There was no pushbutton activations events during this recording.  Background activities marked by continuous reactive background activities with stage changes ranging between 4 cps to 7 cycles.  Faster frequencies better developed on the right as opposed to left.  Left PLED suggestive of cortical irritability present throughout the recording occur every 2 to 3 seconds with maximum negativity at F7. At times left frontotemporal sharp waves and spikes occur in short evolving trains.  No clinical subclinical seizures present  Day 2 : There was no pushbutton activations events during this recording.  Background activities marked by continuous reactive background activities with stage changes ranging between 4 cps  to 7 cycles.  Faster frequencies better developed on the right as opposed to left.  Left ant temporal spikes and left PED suggestive of cortical irritability present throughout the recording occur every 2 to 3 seconds with maximum negativity at F7. At times left frontotemporal sharp waves and spikes occur in short evolving trains 2-3 sec Several brief electrographic seizures arising from left ant temporal region .   Seizures: yes   Day 3: There was no pushbutton activations events during this recording.  Background activities marked by continuous reactive background activities with stage changes ranging .   Left ant temporal spikes and left PED suggestive of cortical irritability present throughout the recording occur every 2 to 3 seconds with maximum negativity at F7. Seizures: no   Day 4: There was no pushbutton activations events during this recording.  Background activities marked by continuous reactive background activities with stage changes ranging .   Left ant temporal spikes and left PED suggestive of cortical irritability present throughout the recording occur every 2 to 3 seconds with maximum negativity at F7. Seizures: no     Clinical interpretation: This day 4 of continuous EEG monitoring with simultaneous video monitoring did not demonstrate any seizures. EEG is abnormal for several reasons.  #1 background activity slowing suggestive of mild encephalopathy of nonspecific etiologies #2 left hemispheric periodic lateralized epileptiform discharges suggestive of cortical irritability particularly in the left anterior frontotemporal region.  Continuous  monitoring is recommended to ensure resolution of clinical and subclinical seizures.  Clinical correlation is advised.

## 2018-12-16 NOTE — Progress Notes (Signed)
ANTICOAGULATION CONSULT NOTE - Initial Consult  Pharmacy Consult for heparin Indication: hx VTE and stroke  Allergies  Allergen Reactions  . Atorvastatin Other (See Comments)    Muscle aches - back     Patient Measurements: Height: 5\' 6"  (167.6 cm)(from 11/14/18 encounter) Weight: 189 lb 2.5 oz (85.8 kg) IBW/kg (Calculated) : 59.3 Heparin Dosing Weight: 78 kg  Vital Signs: Temp: 97.9 F (36.6 C) (03/31 0800) Temp Source: Axillary (03/31 0800) BP: 127/62 (03/31 0900) Pulse Rate: 67 (03/31 0900)  Labs: Recent Labs    12/14/18 0945 12/15/18 1052 12/16/18 0546  HGB 12.8 10.8* 10.1*  HCT 41.1 34.8* 31.8*  PLT 189 155 140*  LABPROT  --  16.3*  --   INR  --  1.3*  --   CREATININE 0.83  --  0.72    Estimated Creatinine Clearance: 58.8 mL/min (by C-G formula based on SCr of 0.72 mg/dL).   Medical History: No past medical history on file.  Medications:  Medications Prior to Admission  Medication Sig Dispense Refill Last Dose  . lisinopril-hydrochlorothiazide (PRINZIDE,ZESTORETIC) 20-25 MG tablet Take 1 tablet by mouth daily.   unknown  . ondansetron (ZOFRAN) 8 MG tablet Take 8 mg by mouth every 8 (eight) hours as needed for nausea or vomiting.   unknown  . warfarin (COUMADIN) 1 MG tablet Take 0.5 mg by mouth daily. Take 1/2 tablet (0.5 mg) by mouth with a 2 mg tablets for a total daily dose of 2.5 mg   unknown  . warfarin (COUMADIN) 2 MG tablet Take 2 mg by mouth daily. Take 1 tablet (2 mg) with 1/2 1 mg tablet (0.5 mg) for a total daily dose of 2.5 mg   unknown    Assessment: 85 y/o female admitted 12/12/2018 for seizure and respiratory failure. She took warfarin PTA for hx VTE and stroke with questionable compliance; it was noted she was on apixaban PTA however refill records indicate she was on warfarin. INR on admit was subtherapeutic at 1.4, last INR was 1.3 on 3/30. Warfarin was initially held for LP however this was unable to be done. Pharmacy consulted to begin IV  heparin while off oral anticoagulation.  No bleeding noted, Hgb trended down to 10s, platelets trended down to 140 (206 on admit). Renal function is normal.  Goal of Therapy:  Heparin level 0.3-0.7 units/ml Monitor platelets by anticoagulation protocol: Yes   Plan:  Begin IV heparin drip at 1200 units/hr with no bolus 8 hr heparin level Daily heparin level and CBC Monitor for s/sx of bleeding F/U restart of warfarin   Renold Genta, PharmD, BCPS Clinical Pharmacist Clinical phone for 12/16/2018 until 3p is [phone] 12/16/2018 9:37 AM  **Pharmacist phone directory can now be found on [email or web address] listed under [place]**

## 2018-12-16 NOTE — Progress Notes (Signed)
LTM EEG Maint complete. Moved EKG checked frontal leads and A1. Continue to monitor

## 2018-12-16 NOTE — Progress Notes (Addendum)
NEUROLOGY PROGRESS NOTE  Subjective: Patient remains intubated, does not follow verbal commands, localizes to sternal rub, attempts to pull ET tube out when not restrained, moving all extremities antigravity especially to noxious stimuli, still hooked up to LTM.  Exam: Vitals:   12/16/18 0800 12/16/18 0813  BP: (!) 108/55 (!) 108/55  Pulse: 69 67  Resp: 17 14  Temp:    SpO2: 100% 100%    Physical Exam  HEENT-  Normocephalic, no lesions, without obvious abnormality.  Normal external eye and conjunctiva.   Extremities- Warm, dry and intact Musculoskeletal-no joint tenderness, deformity or swelling Skin-warm and dry, no hyperpigmentation, vitiligo, or suspicious lesions   Neuro:  Mental Status: Does not follow verbal commands, localizes to sternal rub, attempts to pull ET tube out when not restrained Cranial Nerves: II:  No blink to threat  III,IV, VI: ptosis not present, extra-ocular motions intact bilaterally horizontally, pupils equal, round, reactive to light and accommodation, no nystagmus, no eye deviation. V,VII: face symmetric,grimaces equally bilaterally Motor: Moving all extremities equally antigravity Sensory: Winces to pain, localizes to sternal rub with right hand and attempts to grab endotracheal tube with right hand Deep Tendon Reflexes: Brisk knee jerk and brachioradialis Plantars: Right: downgoing   Left: downgoing     Medications:  Scheduled: . chlorhexidine gluconate (MEDLINE KIT)  15 mL Mouth Rinse BID  . Chlorhexidine Gluconate Cloth  6 each Topical Daily  . feeding supplement (PRO-STAT SUGAR FREE 64)  60 mL Per Tube Daily  . feeding supplement (VITAL HIGH PROTEIN)  1,000 mL Per Tube Q24H  . mouth rinse  15 mL Mouth Rinse 10 times per day  . pantoprazole sodium  40 mg Per Tube Daily  . phenytoin (DILANTIN) IV  100 mg Intravenous Q8H  . sodium chloride flush  10-40 mL Intracatheter Q12H    Pertinent Labs/Diagnostics: -Potassium 2.9 -BUN  24 -Creatinine 0.72  Dg Chest Port 1 View  Result Date: 12/16/2018 CLINICAL DATA:  Intubated, stroke EXAM: PORTABLE CHEST 1 VIEW COMPARISON:  12/12/2018 FINDINGS: Interval placement of left central line with the tip in the upper SVC. No pneumothorax. Endotracheal tube and NG tube are unchanged. Heart is borderline in size. Left basilar airspace opacity, similar prior study. Low lung volumes. No effusion or acute bony abnormality. IMPRESSION: Interval placement of left central line with the tip in the upper SVC. No pneumothorax. Low lung volumes. Left lower lobe atelectasis or infiltrate. Electronically Signed   By: Rolm Baptise M.D.   On: 12/16/2018 07:32   [place] Ekg Site Rite  Result Date: 12/15/2018 If Site Rite image not attached, placement could not be confirmed due to current cardiac rhythm.    Etta Quill PA-C [facility] [phone]   Assessment: 85 year old female with new onset seizures in the setting of an old stroke. Initial presentation of status epilepticus, which is now resolved. She is status post intubation due to respiratory failure. Also has a history of PE and DVT, on Eliquis as an outpatient. 1. Status epilepticus, resolved. On LTM EEG - report from yesterday AM (12-15-2018 ) documented resolution of seizures. However, her EEGwas abnormal for several reasons. #1 background activity slowing suggestive of mild encephalopathy of nonspecific etiologies. #2 left hemispheric periodic lateralized epileptiform discharges suggestive of cortical irritability particularly in the left anterior frontotemporal region.  Awaiting LTM reading for today 12-16-2018 2. Her new onset seizures are felt most likely to be secondary to the old stroke 3. Currently on Vimpat 200 mg BID, Keppra 1250 mg BID  and phenytoin 100 mg TID. Phenytoin level was 16.7 on Sunday.  4. Of note, per family the patient has been slowly deteriorating in terms of her functional status over the last few months  requiring assistance for nearly all ADLs.  Recommendations:  1. Maintain seizure precautions 2. Discontinuing Versed gtt 3. Consider MRI of the brain once the EEG leads are off and there is no concern for ongoing status/electrographic disturbance. 4. Initial attempt at LP unsuccessful due to severe lumbar spondylosis. Unable to obtain fluoro-guided LP per Interventional Radiology due to intubation status. Eliquis had been on hold in anticipation of LP. Anticoagulation with heparin has been started by CCM given her history of DVT/PE, which was the indication for her home anticoagulation regimen.  5. 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 Will need to continue full course of HSV encephalitis and bacterial meningitic coverage as LP unable to be performed - see above. Antibiotic regimen: acyclovir, ampicillin, ceftriaxone and vancomycin.  6. Continue LTM, most likely until this afternoon. If no recurrence of electrographic seizure activity and/or if there is improvement/resolution of LPDs after Versed has been held, will consider discontinuation of LTM.   40 minutes spent in the evaluation and management of this medically complex patient. Time spent included coordination of care.  Electronically signed: Dr. Kerney Elbe 12/16/2018, 9:01 AM

## 2018-12-17 ENCOUNTER — Inpatient Hospital Stay (HOSPITAL_COMMUNITY): Payer: Medicare Other

## 2018-12-17 DIAGNOSIS — M7989 Other specified soft tissue disorders: Secondary | ICD-10-CM

## 2018-12-17 LAB — CBC
HCT: 26.5 % — ABNORMAL LOW (ref 36.0–46.0)
HCT: 33.1 % — ABNORMAL LOW (ref 36.0–46.0)
Hemoglobin: 8 g/dL — ABNORMAL LOW (ref 12.0–15.0)
Hemoglobin: 10.5 g/dL — ABNORMAL LOW (ref 12.0–15.0)
MCH: 29 pg (ref 26.0–34.0)
MCH: 29.3 pg (ref 26.0–34.0)
MCHC: 30.2 g/dL (ref 30.0–36.0)
MCHC: 31.7 g/dL (ref 30.0–36.0)
MCV: 96 fL (ref 80.0–100.0)
MCV: 92.5 fL (ref 80.0–100.0)
Platelets: 126 10*3/uL — ABNORMAL LOW (ref 150–400)
Platelets: 168 10*3/uL (ref 150–400)
RBC: 2.76 MIL/uL — ABNORMAL LOW (ref 3.87–5.11)
RBC: 3.58 MIL/uL — ABNORMAL LOW (ref 3.87–5.11)
RDW: 17 % — ABNORMAL HIGH (ref 11.5–15.5)
RDW: 16.6 % — ABNORMAL HIGH (ref 11.5–15.5)
WBC: 6.7 10*3/uL (ref 4.0–10.5)
WBC: 8.1 10*3/uL (ref 4.0–10.5)
nRBC: 0 % (ref 0.0–0.2)
nRBC: 0 % (ref 0.0–0.2)

## 2018-12-17 LAB — COMPREHENSIVE METABOLIC PANEL
ALT: 13 U/L (ref 0–44)
ANION GAP: 7 (ref 5–15)
AST: 19 U/L (ref 15–41)
Albumin: 2.1 g/dL — ABNORMAL LOW (ref 3.5–5.0)
Alkaline Phosphatase: 43 U/L (ref 38–126)
BUN: 19 mg/dL (ref 8–23)
CO2: 26 mmol/L (ref 22–32)
Calcium: 8.2 mg/dL — ABNORMAL LOW (ref 8.9–10.3)
Chloride: 104 mmol/L (ref 98–111)
Creatinine, Ser: 0.64 mg/dL (ref 0.44–1.00)
GFR calc Af Amer: >60 mL/min (ref >60)
GFR calc non Af Amer: >60 mL/min (ref >60)
Glucose, Bld: 109 mg/dL — ABNORMAL HIGH (ref 70–99)
Potassium: 3.6 mmol/L (ref 3.5–5.1)
SODIUM: 137 mmol/L (ref 135–145)
Total Bilirubin: 0.6 mg/dL (ref 0.3–1.2)
Total Protein: 5.4 g/dL — ABNORMAL LOW (ref 6.5–8.1)

## 2018-12-17 LAB — GLUCOSE, CAPILLARY
Glucose-Capillary: 96 mg/dL (ref 70–99)
Glucose-Capillary: 123 mg/dL — ABNORMAL HIGH (ref 70–99)
Glucose-Capillary: 101 mg/dL — ABNORMAL HIGH (ref 70–99)
Glucose-Capillary: 96 mg/dL (ref 70–99)
Glucose-Capillary: 107 mg/dL — ABNORMAL HIGH (ref 70–99)

## 2018-12-17 LAB — PHOSPHORUS: Phosphorus: 2.7 mg/dL (ref 2.5–4.6)

## 2018-12-17 LAB — MAGNESIUM: Magnesium: 1.8 mg/dL (ref 1.7–2.4)

## 2018-12-17 MED ORDER — FENTANYL CITRATE (PF) 100 MCG/2ML IJ SOLN
12.5000 ug | INTRAMUSCULAR | Status: DC | PRN
  Administered 2018-12-22 – 2018-12-26 (×2): 12.5 ug via INTRAVENOUS
  Filled 2018-12-17 (×3): qty 2

## 2018-12-17 MED ORDER — ALTEPLASE 2 MG IJ SOLR
2.0000 mg | Freq: Once | INTRAMUSCULAR | Status: AC
  Administered 2018-12-17: 11:00:00 2 mg

## 2018-12-17 NOTE — Procedures (Signed)
  Electroencephalogram report- LTM with VIDEO  Ordering Physician : kirkpatrick  Beginning time: 12/16/18 at 0800 Ending time:  12/17/18 at 0800 CPT/type : 95720  Day of study: day 5   Technical Description: The EEG was performed using standard setting per the guidelines of American Clinical Neurophysiology Society (ACNS).    A minimum of 21 electrodes were placed on scalp according to the International 10-20 or/and 10-10 Systems. Supplemental electrodes were placed as needed. Single EKG electrode was also used to detect cardiac arrhythmia. Patient's behavior was continuously recorded on video simultaneously with EEG. A minimum of 16 channels were used for data display. Each epoch of study was reviewed manually daily and as needed using standard referential and bipolar montages. Computerized quantitative EEG analysis (such as compressed spectral array analysis, trending, automated spike & seizure detection) were used as indicated.    Spike detection: ON  Seizure detection: ON   This  continuous  EEG monitoring with simultaneous video monitoring was performed for this patient with convulsions to rule out clinical and subclinical electrographic seizures.  Medications: As per EMR  Day 1 There was no pushbutton activations events during this recording.  Background activities marked by continuous reactive background activities with stage changes ranging between 4 cps to 7 cycles.  Faster frequencies better developed on the right as opposed to left.  Left PLED suggestive of cortical irritability present throughout the recording occur every 2 to 3 seconds with maximum negativity at F7. At times left frontotemporal sharp waves and spikes occur in short evolving trains.  No clinical subclinical seizures present  Day 2 : There was no pushbutton activations events during this recording.  Background activities marked by continuous reactive background activities with stage changes ranging between 4 cps  to 7 cycles.  Faster frequencies better developed on the right as opposed to left.  Left ant temporal spikes and left PED suggestive of cortical irritability present throughout the recording occur every 2 to 3 seconds with maximum negativity at F7. At times left frontotemporal sharp waves and spikes occur in short evolving trains 2-3 sec Several brief electrographic seizures arising from left ant temporal region .   Seizures: yes   Day 3: There was no pushbutton activations events during this recording.  Background activities marked by continuous reactive background activities with stage changes ranging 2-5 cps   Left ant temporal spikes and left PED suggestive of cortical irritability present throughout the recording occur every 2 to 3 seconds with maximum negativity at F7. Seizures: no   Day 4: There was no pushbutton activations events during this recording.  Background activities marked by continuous reactive background activities with stage changes ranging 2-5 cps .   Left ant temporal spikes and left PED suggestive of cortical irritability present throughout the recording occur every 2 to 3 seconds with maximum negativity at F7. Seizures: no   Day 5: There was no pushbutton activations events during this recording.  Background activities marked by continuous reactive background activities with stage changes reaching 5-6 cps.    Left ant temporal spikes present.  left PED become less developed, blunted and irregular .  Seizures: no   Clinical interpretation: This day 5 of continuous EEG monitoring with simultaneous video monitoring did not demonstrate any seizures;  improved background and less prominent  cortical irritability as discussed abive Continuous  monitoring is recommended if concern for clinical and subclinical seizures persist. .  Clinical correlation is advised.

## 2018-12-17 NOTE — Progress Notes (Signed)
Pharmacy Antibiotic + Anticoagulation Note  Holly Cooper is a 85 y.o. female on day # 4 Vancomycin, Ceftriaxone, Ampicillin and Acyclovir to suspected meningitis. Unable to perform LP and planning full course of antibiotics and anitviral. Creatinine trended down since admission; current dosing remains appropriate.    Hx PE and DVT. On Coumadin prior to admission but INR only 1.4 on admit 3/27.   IV heparin begun 3/31 am then converted to SQ Lovenox 3/31 pm.  Hgb trended down. Possibly dilutional. No bleeding reported.  Duplex negative for DVT today.  Plan:  Vancomycin 750 mg IV q12hrs  Vanc trough level prior to 6am dose on 4/2  Ceftriaxone 2gm IV q12hrs.  Ampicillin 2gm IV q4hrs  Acyclovir 700 mg (~10 mg/kg adjusted body weight) IV q8hrs  Lovenox 85 mg SQ q12hrs.  Follow renal function, CBC, clinical progress.  Height: 5\' 6"  (167.6 cm)(from 11/14/18 encounter) Weight: 188 lb 15 oz (85.7 kg) IBW/kg (Calculated) : 59.3  Temp (24hrs), Avg:99.2 F (37.3 C), Min:98.5 F (36.9 C), Max:99.9 F (37.7 C)  Recent Labs  Lab 12/12/18 1822 12/13/18 0534 12/14/18 0945 12/15/18 1052 12/16/18 0546 12/17/18 0430 12/17/18 1016  WBC 12.7* 11.6* 13.2* 10.2 8.1 6.7 8.1  CREATININE 0.87 0.90 0.83  --  0.72 0.64  --     Estimated Creatinine Clearance: 58.8 mL/min (by C-G formula based on SCr of 0.64 mg/dL).    Allergies  Allergen Reactions  . Atorvastatin Other (See Comments)    Muscle aches - back     Antimicrobials this admission:  Vancomycin 3/29>>  Ceftriaxone 3/29>>  Ampicillin 3/29>>  Acyclovir 3/29>>   Dose adjustments this admission:  n/a  Microbiology results:  3/27 MRSA PCR neg  Unable to perform LP (no CSF cultures)  Thank you for allowing pharmacy to be a part of this patient's care.  Arty Baumgartner, [place] Pager: [phone] or phone: [phone] 12/17/2018 12:53 PM

## 2018-12-17 NOTE — Progress Notes (Signed)
Lower extremity venous duplex has been completed.   Preliminary results in CV Proc.   Abram Sander 12/17/2018 11:41 AM

## 2018-12-17 NOTE — Progress Notes (Addendum)
NEUROLOGY PROGRESS NOTE  Subjective: Remains intubated but breathing over the vent, remains on LTM, currently showing no seizure activity with purposeful movements-withdrawing from pain and localizing to sternal rub with both arms  Exam: Vitals:   12/17/18 0825 12/17/18 0900  BP: 125/87 127/67  Pulse: 81 86  Resp: 19 (!) 22  Temp:    SpO2: 100% 100%    Physical Exam   HEENT-  Normocephalic, no lesions, without obvious abnormality.  Normal external eye and conjunctiva.   Extremities- Warm, dry and intact Musculoskeletal-no joint tenderness, deformity or swelling Skin-warm and dry, no hyperpigmentation, vitiligo, or suspicious lesions    Neuro:  Mental Status: Intubated but breathing over the vent Cranial Nerves: II: No blink to threat III,IV, OQ:HUTML-[id number] motions intact bilaterally pupils equal, round, reactive to light and accommodation V,VII: Face symmetric, grimaces to facial noxious stimuli equally and bilaterally VIII: Slightly opens eyes to loud voice  Motor: Moving all extremities antigravity and localizing to sternal rub Sensory: Withdraws from pain bilaterally Deep Tendon Reflexes: Continues to be brisk at knee jerk and brachioradialis Plantars: Right: downgoing   Left: downgoing     Medications:  Scheduled: . chlorhexidine gluconate (MEDLINE KIT)  15 mL Mouth Rinse BID  . Chlorhexidine Gluconate Cloth  6 each Topical Daily  . enoxaparin (LOVENOX) injection  1 mg/kg Subcutaneous Q12H  . feeding supplement (PRO-STAT SUGAR FREE 64)  60 mL Per Tube Daily  . feeding supplement (VITAL HIGH PROTEIN)  1,000 mL Per Tube Q24H  . mouth rinse  15 mL Mouth Rinse 10 times per day  . pantoprazole sodium  40 mg Per Tube Daily  . phenytoin (DILANTIN) IV  100 mg Intravenous Q8H  . sodium chloride flush  10-40 mL Intracatheter Q12H    Pertinent Labs/Diagnostics: LTM results: 3-31- 2020 Clinical interpretation: This day 4 of continuous EEG monitoring with simultaneous  video monitoring did not demonstrate any seizures. EEG is abnormal for several reasons.  #1 background activity slowing suggestive of mild encephalopathy of nonspecific etiologies #2 left hemispheric periodic lateralized epileptiform discharges suggestive of cortical irritability particularly in the left anterior frontotemporal region  Dg Chest Port 1 View  Result Date: 12/16/2018 CLINICAL DATA:  Intubated, stroke EXAM: PORTABLE CHEST 1 VIEW COMPARISON:  12/12/2018 FINDINGS: Interval placement of left central line with the tip in the upper SVC. No pneumothorax. Endotracheal tube and NG tube are unchanged. Heart is borderline in size. Left basilar airspace opacity, similar prior study. Low lung volumes. No effusion or acute bony abnormality. IMPRESSION: Interval placement of left central line with the tip in the upper SVC. No pneumothorax. Low lung volumes. Left lower lobe atelectasis or infiltrate. Electronically Signed   By: Rolm Baptise M.D.   On: 12/16/2018 07:32   [place] Ekg Site Rite  Result Date: 12/15/2018 If Site Rite image not attached, placement could not be confirmed due to current cardiac rhythm.  LTM EEG:  Clinical interpretation: This day 5 of continuous EEG monitoring with simultaneous video monitoring did not demonstrate any seizures. Improved background and less prominent cortical irritability noted.   Etta Quill PA-C [facility] [phone]   Assessment:28 year oldfemalewith new onset seizures in the setting of an old stroke. Initial presentation of status epilepticus, which is now resolved. She is status post intubationdue to respiratory failure. Also has a history of PE and DVT,on Eliquis as an outpatient. 1.Status epilepticus, resolved. On LTM EEG -report from yesterday AM(12-15-2018 ) documented resolution ofseizures. However, herEEGwas abnormal for several reasons. #1 background activity slowing  suggestive of mild encephalopathy of nonspecific  etiologies.#2 left hemispheric periodic lateralized epileptiform discharges suggestive of cortical irritability particularly in the left anterior frontotemporal region. LTM reading for today (4/1) was negative for seizures; there is.improved background and less prominent cortical irritability. 2. Her new onset seizures are felt most likely to be secondary tothe old stroke 3. Currently on Vimpat 200 mg BID, Keppra 1250 mg BID and phenytoin 100 mg TID. Phenytoin level was 16.7 on Sunday.  4. Of note, per familythe patient has been slowly deteriorating in terms of her functional status over the last few months requiring assistance for nearly all ADLs.  Recommendations: 1.Maintain seizure precautions 2.Off Versed gtt 3.Discontinuing LTM EEG. MRI of the brain should be obtained after EEG is discontinued. 4. Given herleukocytosis and new onset seizures, there was concern for HSV encephalitis or meningitis.Will need to continue full course of HSV encephalitis and bacterial meningitic coverage as LP was unable to be performed. Antibiotic regimen: acyclovir, ampicillin, ceftriaxone and vancomycin.  35 minutes spent in the neurological evaluation and management of this patient.   Electronically signed: Dr. Kerney Elbe 12/17/2018, 9:21 AM

## 2018-12-17 NOTE — Progress Notes (Signed)
LTM EEG discontinued per Etta Quill. No skin breakdown at [facility].

## 2018-12-17 NOTE — Progress Notes (Addendum)
NAME:  Holly Cooper, MRN:  [id number], DOB:  1933/11/06, LOS: 5 ADMISSION DATE:  12/12/2018,  CHIEF COMPLAINT:  Seizures, Acute resp failure   Brief History   85 year old woman, history of CVA, history DVT/PE on Eliquis (question compliance). Experienced recurrent seizures that were treated initially with medazepam and then with Keppra.  She was intubated for failure to protect her airway.  Transfer is now to [facility] for further evaluation mechanically ventilated.    Past Medical History  CVA,DVT/ PE,Hyperlipidemia,obesity  Significant Hospital Events   3/28 - loaded with fosphenytoin  3/29 following commands off sedation. Cont EEG continued. Unable to get LP. Started on empiric coverage for HSV encephalitis as well as bacterial coverage 3/30: Interventional radiology unable to do lumbar puncture due to inability lay patient in the prone position 3/31: Hemodynamically stable.  No seizures overnight.  Discontinuing Versed infusion 4/1: Ordering MRI Consults:  Neurology  Procedures:  ETT 3/27 >>   Significant Diagnostic Tests:  Head CT/ CTA 3/27 >> no evidence CVA, no evidence of large vessel occlusion  Micro Data:    Antimicrobials:  vanc 3/29>>> CTX 3/29>>> Amp 3/29>>> Acyclovir 3/29>>>  Interim history/subjective:  No significant change.  Remains encephalopathic  Objective   Blood pressure 127/67, pulse 86, temperature 99 F (37.2 C), temperature source Axillary, resp. rate (Abnormal) 22, height 5\' 6"  (1.676 m), weight 85.7 kg, SpO2 100 %.    Vent Mode: PRVC FiO2 (%):  [30 %] 30 % Set Rate:  [14 bmp] 14 bmp Vt Set:  [470 mL] 470 mL PEEP:  [5 cmH20] 5 cmH20 Plateau Pressure:  [18 cmH20-19 cmH20] 19 cmH20   Intake/Output Summary (Last 24 hours) at 12/17/2018 0931 Last data filed at 12/17/2018 0900 Gross per 24 hour  Intake 3320.53 ml  Output 1750 ml  Net 1570.53 ml    Examination:  General: This is a debilitated 85 year old white female who remains ventilatory  dependent HEENT normocephalic atraumatic orally intubated Neuro: Opens eyes grimaces localizes intermittently following commands no focal deficits Pulmonary: Coarse scattered rhonchi Cardiac: Regular rate and rhythm with systolic murmur consistent with aortic stenosis, this radiates to the left carotid Abdomen: Soft nontender no organomegaly positive bowel sounds Extremities: Warm and dry brisk capillary refill scattered areas of ecchymosis GU: Clear yellow  Resolved Hospital Problem list   Leukocytosis   Assessment & Plan:   Acute encephalopathy (working dx being 2/2 HSV encephalitis) Status epilepticus Etiology unclear.  Currently there is concern for herpes encephalitis versus CNS infection/meningitis, given EEG findings herpes encephalitis being highest on differential diagnosis history CVA/TIA, question medical compliance Plan Cont AEDs per neurology Dc LTM Day 4 acyclovir, amp, CTX and Vanc Getting MRI; if findings c/w HSV encephalitis can dc abx and cont acyclovir  Cont neuro checks Limit sedation    Acute respiratory failure due to failure to protect airway, requiring intubation and mechanical ventilation -mental status is barrier to extubation  Plan Full vent support Initiate SBT and PSV after MRI today Cont VAP bundle Am CXR  Anemia -suspect dilutional but did just start on heparin Plan Lasix  Trend cbc  Fluid and electrolyte imbalance Plan Replace as indicated  History of DVT/PE Anticoagulation held due to potential risk for hemorrhagic conversion if there was an early stroke. Plan  Cont LMWH for now; will need to find out if was on coumadin or Eliquis or perhaps neither as INR was subtherapeutic and PE was back in December Repeat LE dopplers; may be able to consider NOT resuming  History hypertension Plan Holding antihypertensives  Nutrition Plan Tube feeds  Best practice:  Diet: TF start 3/28 Pain/Anxiety/Delirium protocol (if indicated):  Intermittent Versed, fentanyl VAP protocol (if indicated): Ordered 3/27 DVT prophylaxis: SCD GI prophylaxis: Protonix Glucose control: NA Mobility: Bedrest Code Status: Full code Family Communication: No family present currently Disposition: She remains critically ill due to need for ventilator support/airway support due to her altered mental status/acute encephalopathy.  Today we will plan on getting an MRI to further evaluate the working diagnosis of herpes encephalitis.  She is not ready for extubation given her altered sensorium.  Critical care time 32 minutes  Erick Colace ACNP-BC [place] Pager # [phone] OR # [phone] if no answer

## 2018-12-17 NOTE — Progress Notes (Signed)
Patient transported to MRI and back to room 6L54 without complications.

## 2018-12-17 NOTE — Progress Notes (Signed)
RT note: holding SBT this AM while on continuous EEG.  Patient tolerating full ventilator support well.  Will continue to monitor.

## 2018-12-18 DIAGNOSIS — J9601 Acute respiratory failure with hypoxia: Secondary | ICD-10-CM

## 2018-12-18 DIAGNOSIS — G0490 Encephalitis and encephalomyelitis, unspecified: Secondary | ICD-10-CM

## 2018-12-18 LAB — CBC
HCT: 32.7 % — ABNORMAL LOW (ref 36.0–46.0)
Hemoglobin: 10.6 g/dL — ABNORMAL LOW (ref 12.0–15.0)
MCH: 29.9 pg (ref 26.0–34.0)
MCHC: 32.4 g/dL (ref 30.0–36.0)
MCV: 92.4 fL (ref 80.0–100.0)
Platelets: 218 10*3/uL (ref 150–400)
RBC: 3.54 MIL/uL — ABNORMAL LOW (ref 3.87–5.11)
RDW: 16.5 % — ABNORMAL HIGH (ref 11.5–15.5)
WBC: 7.8 10*3/uL (ref 4.0–10.5)
nRBC: 0 % (ref 0.0–0.2)

## 2018-12-18 LAB — COMPREHENSIVE METABOLIC PANEL
ALT: 13 U/L (ref 0–44)
AST: 21 U/L (ref 15–41)
Albumin: 1.9 g/dL — ABNORMAL LOW (ref 3.5–5.0)
Alkaline Phosphatase: 46 U/L (ref 38–126)
Anion gap: 9 (ref 5–15)
BUN: 22 mg/dL (ref 8–23)
CO2: 22 mmol/L (ref 22–32)
Calcium: 8.1 mg/dL — ABNORMAL LOW (ref 8.9–10.3)
Chloride: 106 mmol/L (ref 98–111)
Creatinine, Ser: 0.56 mg/dL (ref 0.44–1.00)
GFR calc Af Amer: >60 mL/min (ref >60)
GFR calc non Af Amer: >60 mL/min (ref >60)
Glucose, Bld: 124 mg/dL — ABNORMAL HIGH (ref 70–99)
Potassium: 3.7 mmol/L (ref 3.5–5.1)
Sodium: 137 mmol/L (ref 135–145)
Total Bilirubin: 0.4 mg/dL (ref 0.3–1.2)
Total Protein: 5 g/dL — ABNORMAL LOW (ref 6.5–8.1)

## 2018-12-18 LAB — GLUCOSE, CAPILLARY
Glucose-Capillary: 109 mg/dL — ABNORMAL HIGH (ref 70–99)
Glucose-Capillary: 103 mg/dL — ABNORMAL HIGH (ref 70–99)
Glucose-Capillary: 94 mg/dL (ref 70–99)
Glucose-Capillary: 108 mg/dL — ABNORMAL HIGH (ref 70–99)
Glucose-Capillary: 93 mg/dL (ref 70–99)
Glucose-Capillary: 107 mg/dL — ABNORMAL HIGH (ref 70–99)

## 2018-12-18 LAB — VANCOMYCIN, TROUGH: Vancomycin Tr: 11 ug/mL — ABNORMAL LOW (ref 15–20)

## 2018-12-18 MED ORDER — ENOXAPARIN SODIUM 40 MG/0.4ML ~~LOC~~ SOLN
40.0000 mg | SUBCUTANEOUS | Status: DC
  Administered 2018-12-19 – 2018-12-20 (×2): 40 mg via SUBCUTANEOUS
  Filled 2018-12-18 (×2): qty 0.4

## 2018-12-18 MED ORDER — ENOXAPARIN SODIUM 40 MG/0.4ML ~~LOC~~ SOLN: 40.0000 mg | SUBCUTANEOUS | Status: DC

## 2018-12-18 MED ORDER — VANCOMYCIN HCL IN DEXTROSE 1-5 GM/200ML-% IV SOLN: 1000.0000 mg | Freq: Two times a day (BID) | INTRAVENOUS | Status: DC

## 2018-12-18 NOTE — Progress Notes (Addendum)
NAME:  Holly Cooper, MRN:  [id number], DOB:  [birth year], LOS: 6 ADMISSION DATE:  12/12/2018,  CHIEF COMPLAINT:  Seizures, Acute resp failure   Brief History   85 year old woman, history of CVA, history DVT/PE on Eliquis (question compliance). Experienced recurrent seizures that were treated initially with medazepam and then with Keppra.  She was intubated for failure to protect her airway.  Transfer is now to [facility] for further evaluation mechanically ventilated.    Past Medical History  CVA,DVT/ PE,Hyperlipidemia,obesity  Significant Hospital Events   3/28 - loaded with fosphenytoin  3/29 following commands off sedation. Cont EEG continued. Unable to get LP. Started on empiric coverage for HSV encephalitis as well as bacterial coverage 3/30: Interventional radiology unable to do lumbar puncture due to inability lay patient in the prone position 3/31: Hemodynamically stable.  No seizures overnight.  Discontinuing Versed infusion 4/1: Ordering MRI 4/2: MRI back, no acute abnormalities.  Lower extremities Dopplers both negative for DVT.  More awake tolerating pressure support ventilation stopping treatment dose low molecular weight heparin continuing supportive care stopping antimicrobial therapy continuing acyclovir with presumptive diagnosis of HSV encephalitis Consults:  Neurology  Procedures:  ETT 3/27 >>  PICC line left upper extremity 3/29 Significant Diagnostic Tests:  Head CT/ CTA 3/27 >> no evidence CVA, no evidence of large vessel occlusion Lower extremity ultrasound 4/1: Negative for DVT bilaterally MRI brain 4/1: Motion degraded.  Chronic small vessel ischemic disease with chronic traumatic cerebellar infarcts no acute intracranial abnormalities Micro Data:    Antimicrobials:  vanc 3/29>>> 4/2 CTX 3/29>>> 4/2 Amp 3/29>>> 4/2 Acyclovir 3/29>>>  Interim history/subjective:  No significant change.  Remains encephalopathic  Objective   Blood pressure 122/78, pulse 85,  temperature 98.5 F (36.9 C), temperature source Axillary, resp. rate 18, height 5\' 6"  (1.676 m), weight 84.9 kg, SpO2 100 %.    Vent Mode: PSV;CPAP FiO2 (%):  [30 %-40 %] 40 % Set Rate:  [14 bmp] 14 bmp Vt Set:  [470 mL] 470 mL PEEP:  [5 cmH20] 5 cmH20 Pressure Support:  [10 cmH20] 10 cmH20 Plateau Pressure:  [17 cmH20-19 cmH20] 17 cmH20   Intake/Output Summary (Last 24 hours) at 12/18/2018 6578 Last data filed at 12/18/2018 0700 Gross per 24 hour  Intake 2774.84 ml  Output 1300 ml  Net 1474.84 ml    Examination:  General: This is an 85 year old female who is currently cycling on pressure support ventilation she is more awake today when comparing her to prior exam HEENT normocephalic atraumatic orally intubated no JVD sclera nonicteric Pulmonary: Clear to auscultation diminished bases tidal volumes acceptable on pressure support of 10 Cardiac: Regular rate and rhythm does have 3 out of 6 systolic murmur consistent with aortic stenosis this radiates to the left carotid Abdomen: Soft nontender no organomegaly Extremities: Warm and dry brisk capillary refill strong pulses Neuro: Awake, interactive, not following commands but much improved when comparing to prior exam  Resolved Hospital Problem list   Leukocytosis   Assessment & Plan:   Acute encephalopathy (working dx being 2/2 HSV encephalitis) Status epilepticus No further seizure activity More awake and appropriate as of 4/2 Plan Continuing AEDs per neurology  Day #5 acyclovir, ampicillin, ceftriaxone and vancomycin  cont AEDs per neurology Continue neurochecks  Acute respiratory failure due to failure to protect airway, requiring intubation and mechanical ventilation -mental status is barrier to extubation  Plan Continuing pressure support VAP bundle Hoping for extubation next 24 hours as mental status appears to be improving  Anemia Plan Continue to trend CBC  Fluid and electrolyte imbalance Plan Trend and  replace as needed  History of DVT/PE Anticoagulation held due to potential risk for hemorrhagic conversion if there was an early stroke. Plan  We will change her to prophylactic dosing.  Her DVT/PE was over 3 months ago lower extremity Dopplers are negative Should she develop recurrent DVT or thromboembolic event will need lifelong anticoagulation  History hypertension Plan Continuing to hold antihypertensives  Nutrition Plan Continue tube feeds  Best practice:  Diet: TF start 3/28 Pain/Anxiety/Delirium protocol (if indicated): Intermittent Versed, fentanyl VAP protocol (if indicated): Ordered 3/27 DVT prophylaxis: SCD GI prophylaxis: Protonix Glucose control: NA Mobility: Bedrest Code Status: Full code Family Communication: No family present currently Disposition: She remains critically ill due to need for mechanical ventilation and titration of pressure support ventilation for weaning assessment.  Luckily her mental status appears to be improving slowly.  Hopefully she will be ready for extubation in the next 24 to 48 hours as mental status continues to improve.  We will will continue to treat her with a presumptive diagnosis of HSV encephalitis although MRI was non-revealing.  We will discontinue antibiotics today and continue acyclovir.  I have asked neurology recommend the following: Course of therapy for HSV, anticonvulsants, and time for neurological follow-up   Erick Colace ACNP-BC [place] Pager # [phone] OR # [phone] if no answer

## 2018-12-18 NOTE — Progress Notes (Addendum)
NEUROLOGY PROGRESS NOTE  Subjective: Patient appears to be doing better today.  She is breathing over the vent.  She is attempting to vocalize however she is intubated at this time.  She is following commands such as attempting to raise her legs and arms.  Exam: Vitals:   12/18/18 0700 12/18/18 0745  BP: 122/78 122/78  Pulse: 77 85  Resp: 15 18  Temp:    SpO2: 100% 100%    Physical Exam   HEENT-  Normocephalic, no lesions, without obvious abnormality.  Normal external eye and conjunctiva.   Extremities- Warm, dry and intact Musculoskeletal-no joint tenderness, deformity or swelling Skin-warm and dry, no hyperpigmentation, vitiligo, or suspicious lesions   Neuro:  Mental Status: Alert, attempting to speak with intubation tube in place, following simple commands such as attempting to raise her arms and legs Cranial Nerves: II: Could not get blink to threat but I feel she clearly can see me,  III,IV, VI: Tracking me in the room, pupils equal round reactive to light VII: No gross asymmetry in the context of intubation VIII: hearing intact to voice. Motor: Moving all extremities antigravity. No asymmetry. Sensory: Withdraws from noxious stimuli Deep Tendon Reflexes: Continues to have brisk  knee jerks and brachioradialis reflexes Plantars: Right: downgoing                                Left: downgoing    Medications:  Scheduled: . chlorhexidine gluconate (MEDLINE KIT)  15 mL Mouth Rinse BID  . Chlorhexidine Gluconate Cloth  6 each Topical Daily  . enoxaparin (LOVENOX) injection  40 mg Subcutaneous Q24H  . feeding supplement (PRO-STAT SUGAR FREE 64)  60 mL Per Tube Daily  . feeding supplement (VITAL HIGH PROTEIN)  1,000 mL Per Tube Q24H  . mouth rinse  15 mL Mouth Rinse 10 times per day  . pantoprazole sodium  40 mg Per Tube Daily  . phenytoin (DILANTIN) IV  100 mg Intravenous Q8H  . sodium chloride flush  10-40 mL Intracatheter Q12H    Pertinent  Labs/Diagnostics:   Mr Brain Wo Contrast  Result Date: 12/17/2018 CLINICAL DATA:  Encephalopathy. New onset seizures. Initial presentation of status epilepticus, now resolved. Intubated due to respiratory failure. EXAM: MRI HEAD WITHOUT CONTRAST TECHNIQUE: Multiplanar, multiecho pulse sequences of the brain and surrounding structures were obtained without intravenous contrast. COMPARISON:  Head CT 12/12/2018 FINDINGS: Multiple sequences are moderately motion degraded. The degree of movement precluded performing thin-section seizure protocol temporal lobe imaging. Brain: There is no evidence of acute infarct, intracranial hemorrhage, mass, midline shift, or extra-axial fluid collection. There is moderate cerebral atrophy. Multiple small chronic infarcts are present in both cerebellar hemispheres as well as in the right thalamus. Cerebral white matter T2 hyperintensities are nonspecific but compatible with mild chronic small vessel ischemic disease. Vascular: Major intracranial vascular flow voids are preserved. Skull and upper cervical spine: Unremarkable bone marrow signal. Sinuses/Orbits: Bilateral cataract extraction. Mild scattered mucosal thickening in the paranasal sinuses. Small mastoid effusions, right larger than left. Other: None. IMPRESSION: 1. Motion degraded examination. 2. No acute intracranial abnormality. 3. Chronic small vessel ischemic disease with chronic thalamic and cerebellar infarcts. Electronically Signed   By: Logan Bores M.D.   On: 12/17/2018 16:33      Etta Quill PA-C [facility] [phone]  Assessment:80 year oldfemalewith new onset seizures in the setting of an old stroke. Initial presentationofstatus epilepticus, which is now resolved. She is status  post intubationdue to respiratory failure. Also has a history of PE and DVT,on Eliquis as an outpatient. 1.Status epilepticus, resolved.  2. Her new onset seizures are felt most likely to be secondary  tothe old stroke 3. Currently on Vimpat200 mg BID, Keppra 1250 mg BID and phenytoin 100 mg TID. Phenytoin level was 16.7on Sunday. Obtaining a repeat level on Friday AM. 4. Of note, per familythe patient has been slowly deteriorating in terms of her functional status over the last few months requiring assistance for nearly all ADLs. 5. MRI brain completed yesterday. Atrophy and chronic infarcts were noted. No acute findings were seen.   Recommendations: 1.Maintain seizure precautions and continue to closely monitor for additional improvement 2.LP was unable to be performed. Patient should continue to receive empiric acyclovir for a total of 14 days. Vancomycin, ceftriaxone and ampicillin have been discontinued by CCM.  3. Patient should follow-up with neurology within 2-4 weeks after discharge 4. Should continue indefinitely on antiepileptic medication as she has an old stroke which may serve as a seizure onset zone  At her outpatient neurology follow up, would consider tapering off one or more of the 3 anticonvulsant medications towards the goal of being on monotherapy.  5. Attempt to wean off ventilator and extubate, followed by OOB to chair qd, then PT/OT with eventual goal of inpatient or outpatient rehab versus PT/OT at SNF. 6. Neurohospitalist service willl sign off. Please call if there are additional questions.   35 minutes spent in the neurological evaluation and management of this patient. Time spent included coordination of care.   Electronically signed: Dr. Kerney Elbe 12/18/2018, 9:27 AM

## 2018-12-18 NOTE — Progress Notes (Addendum)
Pharmacy Antibiotic + Anticoagulation Note  Holly Cooper is a 85 y.o. female on day # 5 Vancomycin, Ceftriaxone, Ampicillin and Acyclovir to suspected meningitis. Unable to perform LP and planning full course of antibiotics and anitviral. Creatinine trended down since admission.       Vancomycin trough 11 mcg/ml drawn ~1.5 hrs late. Estimate true trough ~12 mcg/ml. Below target range of 15-20 mcg/ml.    Hx PE and DVT. On Coumadin prior to admission but INR only 1.4 on admit 3/27.   IV heparin begun 3/31 am then converted to SQ Lovenox 3/31 pm.  Hgb trended down 10.1>8.0 on 4/1, possibly dilutional. Hgb back to 10.5 today. No bleeding reported.  Duplex negative for DVT 4/1  Plan:  Increase Vancomycin to 1gm IV q12hrs  Target troughs 15-20 mcg/ml  Ceftriaxone 2gm IV q12hrs.  Ampicillin 2gm IV q4hrs  Acyclovir 700 mg (~10 mg/kg adjusted body weight) IV q8hrs  Lovenox 85 mg SQ q12hrs.  Follow renal function, CBC, clinical progress, antibiotic/antiviral plans.  Height: 5\' 6"  (167.6 cm)(from 11/14/18 encounter) Weight: 187 lb 2.7 oz (84.9 kg) IBW/kg (Calculated) : 59.3  Temp (24hrs), Avg:98.9 F (37.2 C), Min:98.1 F (36.7 C), Max:99.7 F (37.6 C)  Recent Labs  Lab 12/13/18 0534 12/14/18 0945 12/15/18 1052 12/16/18 0546 12/17/18 0430 12/17/18 1016 12/18/18 0656  WBC 11.6* 13.2* 10.2 8.1 6.7 8.1  --   CREATININE 0.90 0.83  --  0.72 0.64  --  0.56  VANCOTROUGH  --   --   --   --   --   --  11*    Estimated Creatinine Clearance: 58.5 mL/min (by C-G formula based on SCr of 0.56 mg/dL).    Allergies  Allergen Reactions  . Atorvastatin Other (See Comments)    Muscle aches - back     Antimicrobials this admission:  Vancomycin 3/29>>  Ceftriaxone 3/29>>  Ampicillin 3/29>>  Acyclovir 3/29>>   Dose adjustments this admission:  4/2: VT 11 mcg/ml (1.5 hrs late) on 750 mg IV q12h > incr to 1 gm IV q12h  Microbiology results:  3/27 MRSA PCR neg  Unable to perform LP (no CSF  cultures)  Thank you for allowing pharmacy to be a part of this patient's care.  Arty Baumgartner, [place] Pager: [phone] or phone: [phone] 12/18/2018 9:32 AM   Addendum:    Lovenox changing to prophylactic dosing.  Lovenox 85 mg SQ given this am.  Lovenox 40 mg SQ q24hrs to begin on 4/3.    Antibiotics discontinued.  Acyclovir to continue x 14 days.  Stop date added per discussion with Dr. Cheral Marker.  Thru 12/28/18 2pm dose.  Consuello Masse, RPh 12/18/2018 10:36 AM

## 2018-12-18 NOTE — Plan of Care (Signed)
  Problem: Clinical Measurements: Goal: Complications related to the disease process, condition or treatment will be avoided or minimized Outcome: Progressing   

## 2018-12-18 NOTE — Progress Notes (Signed)
RT note: patient placed on CPAP/PSV of 10/5 at 0745.  Currently tolerating well.  Will continue to monitor.

## 2018-12-18 NOTE — Progress Notes (Signed)
RT note: patient placed back on full support ventilation due to increased respiratory rate.  Tolerating well.  Will continue to monitor.

## 2018-12-18 NOTE — Progress Notes (Signed)
Assisted with teleconference between patient, patient's daughters, and bedside staff.

## 2018-12-19 LAB — COMPREHENSIVE METABOLIC PANEL
ALT: 20 U/L (ref 0–44)
AST: 30 U/L (ref 15–41)
Albumin: 1.7 g/dL — ABNORMAL LOW (ref 3.5–5.0)
Alkaline Phosphatase: 44 U/L (ref 38–126)
Anion gap: 9 (ref 5–15)
BUN: 23 mg/dL (ref 8–23)
CO2: 24 mmol/L (ref 22–32)
Calcium: 8.4 mg/dL — ABNORMAL LOW (ref 8.9–10.3)
Chloride: 102 mmol/L (ref 98–111)
Creatinine, Ser: 0.61 mg/dL (ref 0.44–1.00)
GFR calc Af Amer: >60 mL/min (ref >60)
GFR calc non Af Amer: >60 mL/min (ref >60)
Glucose, Bld: 115 mg/dL — ABNORMAL HIGH (ref 70–99)
Potassium: 3.3 mmol/L — ABNORMAL LOW (ref 3.5–5.1)
Sodium: 135 mmol/L (ref 135–145)
Total Bilirubin: 0.5 mg/dL (ref 0.3–1.2)
Total Protein: 4.8 g/dL — ABNORMAL LOW (ref 6.5–8.1)

## 2018-12-19 LAB — GLUCOSE, CAPILLARY
Glucose-Capillary: 105 mg/dL — ABNORMAL HIGH (ref 70–99)
Glucose-Capillary: 104 mg/dL — ABNORMAL HIGH (ref 70–99)
Glucose-Capillary: 102 mg/dL — ABNORMAL HIGH (ref 70–99)
Glucose-Capillary: 107 mg/dL — ABNORMAL HIGH (ref 70–99)
Glucose-Capillary: 88 mg/dL (ref 70–99)
Glucose-Capillary: 91 mg/dL (ref 70–99)
Glucose-Capillary: 108 mg/dL — ABNORMAL HIGH (ref 70–99)

## 2018-12-19 LAB — PHENYTOIN LEVEL, TOTAL: Phenytoin Lvl: 4.8 ug/mL — ABNORMAL LOW (ref 10.0–20.0)

## 2018-12-19 MED ORDER — FUROSEMIDE 10 MG/ML IJ SOLN
40.0000 mg | Freq: Once | INTRAMUSCULAR | Status: AC
  Administered 2018-12-19: 11:00:00 40 mg via INTRAVENOUS
  Filled 2018-12-19: qty 4

## 2018-12-19 MED ORDER — POTASSIUM CHLORIDE 20 MEQ/15ML (10%) PO SOLN
40.0000 meq | ORAL | Status: AC
  Administered 2018-12-19 (×2): 40 meq
  Filled 2018-12-19 (×2): qty 30

## 2018-12-19 MED ORDER — PRO-STAT SUGAR FREE PO LIQD
60.0000 mL | Freq: Two times a day (BID) | ORAL | Status: DC
  Administered 2018-12-20 – 2018-12-29 (×18): 60 mL
  Filled 2018-12-19 (×19): qty 60

## 2018-12-19 MED ORDER — JEVITY 1.2 CAL PO LIQD
1000.0000 mL | ORAL | Status: DC
  Administered 2018-12-20: 08:00:00
  Administered 2018-12-21 – 2018-12-28 (×5): 1000 mL
  Filled 2018-12-19 (×11): qty 1000

## 2018-12-19 MED ORDER — VITAL HIGH PROTEIN PO LIQD: 1000.0000 mL | ORAL | Status: DC

## 2018-12-19 MED ORDER — POTASSIUM CHLORIDE 20 MEQ/15ML (10%) PO SOLN: 40.0000 meq | Freq: Once | ORAL | Status: DC

## 2018-12-19 MED ORDER — VITAL HIGH PROTEIN PO LIQD: 1000.0000 mL | ORAL | Status: AC

## 2018-12-19 NOTE — Progress Notes (Signed)
NAME:  Holly Cooper, MRN:  [id number], DOB:  [birth year], LOS: 7 ADMISSION DATE:  12/12/2018,  CHIEF COMPLAINT:  Seizures, Acute resp failure   Brief History   85 year old woman, history of CVA, history DVT/PE on Eliquis (question compliance). Experienced recurrent seizures that were treated initially with medazepam and then with Keppra.  She was intubated for failure to protect her airway.  Transfer is now to [facility] for further evaluation mechanically ventilated.    Past Medical History  CVA,DVT/ PE,Hyperlipidemia,obesity  Significant Hospital Events   3/28 - loaded with fosphenytoin  3/29 following commands off sedation. Cont EEG continued. Unable to get LP. Started on empiric coverage for HSV encephalitis as well as bacterial coverage 3/30: Interventional radiology unable to do lumbar puncture due to inability lay patient in the prone position 3/31: Hemodynamically stable.  No seizures overnight.  Discontinuing Versed infusion 4/1: Ordering MRI 4/2: MRI back, no acute abnormalities.  Lower extremities Dopplers both negative for DVT.  More awake tolerating pressure support ventilation stopping treatment dose low molecular weight heparin continuing supportive care stopping antimicrobial therapy continuing acyclovir with presumptive diagnosis of HSV encephalitis 4/3: Now following commands.  Pressure support ventilation and spontaneous breathing trial attempted.  Assessing for extubation Consults:  Neurology  Procedures:  ETT 3/27 >>  PICC line left upper extremity 3/29 Significant Diagnostic Tests:  Head CT/ CTA 3/27 >> no evidence CVA, no evidence of large vessel occlusion Lower extremity ultrasound 4/1: Negative for DVT bilaterally MRI brain 4/1: Motion degraded.  Chronic small vessel ischemic disease with chronic traumatic cerebellar infarcts no acute intracranial abnormalities Micro Data:    Antimicrobials:  vanc 3/29>>> 4/2 CTX 3/29>>> 4/2 Amp 3/29>>> 4/2 Acyclovir 3/29>>>  Interim history/subjective:  Much more awake  Objective   Blood pressure (Abnormal) 99/53, pulse 76, temperature 99.7 F (37.6 C), temperature source Axillary, resp. rate 16, height 5\' 6"  (1.676 m), weight 87.7 kg, SpO2 93 %.    Vent Mode: PRVC FiO2 (%):  [40 %] 40 % Set Rate:  [14 bmp] 14 bmp Vt Set:  [470 mL] 470 mL PEEP:  [5 cmH20] 5 cmH20 Plateau Pressure:  [18 cmH20-21 cmH20] 21 cmH20   Intake/Output Summary (Last 24 hours) at 12/19/2018 0913 Last data filed at 12/19/2018 0700 Gross per 24 hour  Intake 1908.66 ml  Output 1500 ml  Net 408.66 ml    Examination:  General: Is 85 year old female currently awake, interactive, following commands this morning HEENT normocephalic atraumatic orally intubated jugular venous distention absent mucous membranes are moist Pulmonary: Clear.  Diminished left greater than right but no accessory use.  Tidal volumes in the low 300s on pressure support of 5 Cardiac: 3 out of 6 systolic murmur consistent with aortic stenosis this radiates to the left carotid Abdomen: Soft nontender no organomegaly Extremities: Warm and dry, has dependent edema bilaterally in the upper extremities there are scattered areas of ecchymosis Neuro: Awake, following commands today, much more interactive GU: Clear yellow  Resolved Hospital Problem list   Leukocytosis  History of treated DVT/PE completed 3 months of anticoagulation Assessment & Plan:   Acute encephalopathy (working dx being 2/2 HSV encephalitis) Status epilepticus No further seizure activity More awake and appropriate as of 4/2 Plan Day #6 of 14 acyclovir  Continuing AEDs indefinitely  Will need neurology follow-up 2 to 4 weeks after discharge   Acute respiratory failure due to failure to protect airway, requiring intubation and mechanical ventilation -mental status is barrier to extubation, mental status improved Plan Pressure  support ventilation as tolerated, currently on spontaneous breathing  trial will assess for extubation later this morning versus ongoing pressure support Continue VAP bundle Gentle diuresis  Anemia Plan Trend CBC  Fluid and electrolyte imbalance: Hypokalemia Plan Replace and recheck as indicated  History hypertension Plan Holding antihypertensives  Nutrition Plan Continue tube feeds  Best practice:  Diet: TF start 3/28 Pain/Anxiety/Delirium protocol (if indicated): Intermittent Versed, fentanyl VAP protocol (if indicated): Ordered 3/27 DVT prophylaxis: SCD GI prophylaxis: Protonix Glucose control: NA Mobility: Bedrest Code Status: Full code Family Communication: No family present currently Disposition: She remains critically ill due to her need for titration and ongoing assessment of her ventilatory needs.  Her mental status is remarkably better I am hopeful she can be extubated later today  My critical care x32 minutes   Erick Colace ACNP-BC [place] Pager # [phone] OR # [phone] if no answer

## 2018-12-19 NOTE — Progress Notes (Signed)
Follow-up assessment of spontaneous breathing trial: Patient's been on spontaneous breathing trial for approximately 45 minutes.  Her tidal volumes have been in the high 200 to low 300 range with frequency tidal volume ratio at best of 100, however easily much higher.  She seems a little more fatigued, now has some slight accessory use with diaphragmatic efforts.  I do not think she is quite ready for extubation  Plan Continue pressure support ventilation, will titrate up pressure support to allow her to continue weaning Get her out of bed PAD protocol RA SS goal 0 VAP bundle Lasix x1  Hopefully extubate in the next 24 hours as mental status is improved greatly  Erick Colace ACNP-BC [place] Pager # [phone] OR # [phone] if no answer

## 2018-12-19 NOTE — Progress Notes (Signed)
Assisted with video conferencing between patient, patient's family, and bedside staff.

## 2018-12-19 NOTE — Progress Notes (Signed)
Assisted with video conference call between patient and patient's family.

## 2018-12-19 NOTE — Progress Notes (Signed)
Nutrition Follow-up  DOCUMENTATION CODES:   Obesity unspecified  INTERVENTION:   Increase Vital High Protein to 60 ml/hr x 15 hours (run until 8 am)  If not extubated start Jevity 1.2 @ 41 ml/hr 60 ml Prostat BID  Provides: 1580 kcal (98% of needs), 115 grams protein (100% of needs), and 798 ml free water.   NUTRITION DIAGNOSIS:   Inadequate oral intake related to inability to eat as evidenced by NPO status.  GOAL:   Provide needs based on ASPEN/SCCM guidelines  MONITOR:   Vent status, Labs, Weight trends, Skin, TF tolerance, I & O's  REASON FOR ASSESSMENT:   Ventilator, Consult Enteral/tube feeding initiation and management  ASSESSMENT:   85 year old woman, history of CVA, history DVT/PE on Eliquis (question compliance). Experienced recurrent seizures that were treated initially with medazepam and then with Keppra.  She was intubated for failure to protect her airway.  Transfer is now to [facility] for further evaluation mechanically ventilated.   Pt admitted with acute encephalopathy due to HSV  Pt is weaning on vent, plan for trial of extubation per CCM.   Patient is currently intubated on ventilator support. OGT in placed MV: 10.9 L/min Temp (24hrs), Avg:99.3 F (37.4 C), Min:98 F (36.7 C), Max:100.3 F (37.9 C)  NG tube - L nare   NUTRITION - FOCUSED PHYSICAL EXAM:  Deferred   Diet Order:   Diet Order            Diet NPO time specified  Diet effective now              EDUCATION NEEDS:   Not appropriate for education at this time  Skin:  Skin Assessment: Reviewed RN Assessment  Last BM:  Unknown  Height:   Ht Readings from Last 1 Encounters:  12/13/18 5\' 6"  (1.676 m)    Weight:   Wt Readings from Last 1 Encounters:  12/19/18 87.7 kg    Ideal Body Weight:  59.1 kg  BMI:  Body mass index is 31.21 kg/m.  Estimated Nutritional Needs:   Kcal:  732-2025  Protein:  > 118 grams  Fluid:  > 1.1 L  Maylon Peppers RD, LDN,  CNSC [phone] Pager [phone] After Hours Pager

## 2018-12-20 ENCOUNTER — Inpatient Hospital Stay (HOSPITAL_COMMUNITY): Payer: Medicare Other

## 2018-12-20 ENCOUNTER — Inpatient Hospital Stay: Payer: Self-pay

## 2018-12-20 DIAGNOSIS — I82409 Acute embolism and thrombosis of unspecified deep veins of unspecified lower extremity: Secondary | ICD-10-CM

## 2018-12-20 LAB — GLUCOSE, CAPILLARY
Glucose-Capillary: 91 mg/dL (ref 70–99)
Glucose-Capillary: 106 mg/dL — ABNORMAL HIGH (ref 70–99)
Glucose-Capillary: 89 mg/dL (ref 70–99)
Glucose-Capillary: 116 mg/dL — ABNORMAL HIGH (ref 70–99)
Glucose-Capillary: 101 mg/dL — ABNORMAL HIGH (ref 70–99)
Glucose-Capillary: 114 mg/dL — ABNORMAL HIGH (ref 70–99)

## 2018-12-20 LAB — BASIC METABOLIC PANEL
Anion gap: 8 (ref 5–15)
BUN: 27 mg/dL — ABNORMAL HIGH (ref 8–23)
CO2: 26 mmol/L (ref 22–32)
Calcium: 8.4 mg/dL — ABNORMAL LOW (ref 8.9–10.3)
Chloride: 102 mmol/L (ref 98–111)
Creatinine, Ser: 0.58 mg/dL (ref 0.44–1.00)
GFR calc Af Amer: >60 mL/min (ref >60)
GFR calc non Af Amer: >60 mL/min (ref >60)
Glucose, Bld: 119 mg/dL — ABNORMAL HIGH (ref 70–99)
Potassium: 3.8 mmol/L (ref 3.5–5.1)
Sodium: 136 mmol/L (ref 135–145)

## 2018-12-20 MED ORDER — ALTEPLASE 2 MG IJ SOLR: 2.0000 mg | Freq: Once | INTRAMUSCULAR | Status: DC

## 2018-12-20 MED ORDER — ACETAMINOPHEN 160 MG/5ML PO SOLN
650.0000 mg | Freq: Four times a day (QID) | ORAL | Status: DC | PRN
  Administered 2018-12-20 – 2018-12-30 (×5): 650 mg
  Filled 2018-12-20 (×6): qty 20.3

## 2018-12-20 MED ORDER — ENOXAPARIN SODIUM 100 MG/ML ~~LOC~~ SOLN
85.0000 mg | Freq: Two times a day (BID) | SUBCUTANEOUS | Status: DC
  Administered 2018-12-20 – 2018-12-26 (×12): 85 mg via SUBCUTANEOUS
  Filled 2018-12-20 (×13): qty 0.85

## 2018-12-20 NOTE — Progress Notes (Signed)
Tele video call provided to family.

## 2018-12-20 NOTE — Progress Notes (Signed)
Another tele video call set up with another number given however no one joined the call from the second number given

## 2018-12-20 NOTE — Plan of Care (Signed)
  Problem: Clinical Measurements: Goal: Ability to maintain clinical measurements within normal limits will improve Outcome: Progressing   

## 2018-12-20 NOTE — Progress Notes (Signed)
NAME:  Holly Cooper, MRN:  [id number], DOB:  08-14-1934, LOS: 8 ADMISSION DATE:  12/12/2018,  CHIEF COMPLAINT:  Seizures, Acute resp failure   Brief History   85 year old woman, history of CVA, history DVT/PE on Eliquis (question compliance). Experienced recurrent seizures that were treated initially with medazepam and then with Keppra.  She was intubated for failure to protect her airway.  Transfer is now to [facility] for further evaluation mechanically ventilated. Placed on pressure support this am.     Past Medical History  CVA,DVT/ PE,Hyperlipidemia,obesity  Significant Hospital Events   3/28 - loaded with fosphenytoin  3/29 following commands off sedation. Cont EEG continued. Unable to get LP. Started on empiric coverage for HSV encephalitis as well as bacterial coverage 3/30: Interventional radiology unable to do lumbar puncture due to inability lay patient in the prone position 3/31: Hemodynamically stable.  No seizures overnight.  Discontinuing Versed infusion 4/1: Ordering MRI 4/2: MRI back, no acute abnormalities.  Lower extremities Dopplers both negative for DVT.  More awake tolerating pressure support ventilation stopping treatment dose low molecular weight heparin continuing supportive care stopping antimicrobial therapy continuing acyclovir with presumptive diagnosis of HSV encephalitis 4/3: Now following commands.  Pressure support ventilation and spontaneous breathing trial attempted.  Assessing for extubation 4/4:Tolerating fairly well but with low tvs (250-350 cc). I dont believe that her current respiratory effort is adequate for extubation. Consults:  Neurology  Procedures:  ETT 3/27 >>  PICC line left upper extremity 3/29 Significant Diagnostic Tests:  Head CT/ CTA 3/27 >> no evidence CVA, no evidence of large vessel occlusion Lower extremity ultrasound 4/1: Negative for DVT bilaterally MRI brain 4/1: Motion degraded.  Chronic small vessel ischemic disease with  chronic traumatic cerebellar infarcts no acute intracranial abnormalities Micro Data:    Antimicrobials:  vanc 3/29>>> 4/2 CTX 3/29>>> 4/2 Amp 3/29>>> 4/2 Acyclovir 3/29>>>  Interim history/subjective:  Much more awake  Objective   Blood pressure (!) 92/54, pulse 78, temperature 99.5 F (37.5 C), temperature source Axillary, resp. rate (!) 24, height 5\' 6"  (1.676 m), weight 87.7 kg, SpO2 100 %.    Vent Mode: CPAP;PSV FiO2 (%):  [40 %] 40 % Set Rate:  [14 bmp] 14 bmp Vt Set:  [470 mL] 470 mL PEEP:  [5 cmH20] 5 cmH20 Pressure Support:  [12 cmH20] 12 cmH20 Plateau Pressure:  [16 cmH20] 16 cmH20   Intake/Output Summary (Last 24 hours) at 12/20/2018 1000 Last data filed at 12/20/2018 0700 Gross per 24 hour  Intake 937.94 ml  Output 2050 ml  Net -1112.06 ml    Examination:  General: Is 85 year old female currently awake, interactive, following commands this morning HEENT normocephalic atraumatic orally intubated jugular venous distention absent mucous membranes are moist Pulmonary: Clear.  Diminished left greater than right but no accessory use.  Tidal volumes in the low 300s on pressure support of 5 Cardiac: 3 out of 6 systolic murmur consistent with aortic stenosis this radiates to the left carotid Abdomen: Soft nontender no organomegaly Extremities: Warm and dry, has dependent edema bilaterally in the upper R> left extremities there are scattered areas of ecchymosis Neuro: Awake, following commands today, much more interactive GU: Clear yellow  Resolved Hospital Problem list   Leukocytosis  History of treated DVT/PE completed 3 months of anticoagulation Assessment & Plan:   Acute encephalopathy (working dx being 2/2 HSV encephalitis) Status epilepticus No further seizure activity More awake and appropriate as of 4/2 Plan Day #6 of 14 acyclovir  Continuing AEDs indefinitely  Will need neurology follow-up 2 to 4 weeks after discharge   Acute respiratory failure due to  failure to protect airway, requiring intubation and mechanical ventilation -mental status is barrier to extubation, mental status improved Plan Pressure support ventilation as tolerated, currently on spontaneous breathing trial will continue ongoing pressure support Continue VAP bundle Gentle diuresis  Anemia Plan Trend CBC  Fluid and electrolyte imbalance: Hypokalemia Plan Replace and recheck as indicated  History hypertension Plan Holding antihypertensives  Nutrition Plan Continue tube feeds  Best practice:  Diet: TF start 3/28 Pain/Anxiety/Delirium protocol (if indicated): Intermittent Versed, fentanyl VAP protocol (if indicated): Ordered 3/27 DVT prophylaxis: SCD GI prophylaxis: Protonix Glucose control: NA Mobility: Bedrest Code Status: Full code Family Communication: No family present currently Disposition: She remains critically ill due to her need for titration and ongoing assessment of her ventilatory needs.  Her mental status is remarkably better I am hopeful she can be extubated in the next 24 hours.  Probably will have to extubate to BIPAP  My critical care x33 minutes   Erick Colace ACNP-BC [place] Pager # [phone] OR # [phone] if no answer

## 2018-12-20 NOTE — Progress Notes (Signed)
Video Tele call provided to patients best friend Sula Soda and her husband Saralyn Pilar. They belong to the same church and did house visits together.

## 2018-12-20 NOTE — Progress Notes (Signed)
ANTICOAGULATION CONSULT NOTE - Initial Consult  Pharmacy Consult: Lovenox Indication:  Acute DVT  Allergies  Allergen Reactions  . Atorvastatin Other (See Comments)    Muscle aches - back     Patient Measurements: Height: 5\' 6"  (167.6 cm)(from 11/14/18 encounter) Weight: 193 lb 5.5 oz (87.7 kg) IBW/kg (Calculated) : 59.3  Vital Signs: Temp: 99.5 F (37.5 C) (04/04 1200) Temp Source: Axillary (04/04 1200) BP: 142/83 (04/04 1508) Pulse Rate: 102 (04/04 1508)  Labs: Recent Labs    12/18/18 0656 12/18/18 1124 12/19/18 0548 12/20/18 0616  HGB  --  10.6*  --   --   HCT  --  32.7*  --   --   PLT  --  218  --   --   CREATININE 0.56  --  0.61 0.58    Estimated Creatinine Clearance: 59.5 mL/min (by C-G formula based on SCr of 0.58 mg/dL).   Medical History: No past medical history on file.    Assessment: 25 YOF presented on 12/12/18 with seizure and acute respiratory failure.  Patient has a history of DVT/PE on Coumadin PTA, but patient's compliance was quesitonable.  INR was sub-therapeutic on admit.  Currently on prophylactic Lovenox and dose was given around 0900 today.  Doppler today 12/20/18 shows  acute DVT involving the right axillary/brachial veins and acute superficial vein thrombosis of right basilic/cephalic veins.  Pharmacy consulted to dose Lovenox for VTE treatment.  Renal function, LFTs and CBC stable.  No bleeding reported.  Goal of Therapy:  Anti-Xa level 0.6-1 units/ml 4hrs after LMWH dose given Monitor platelets by anticoagulation protocol: Yes   Plan:  Lovenox 85mg  SQ Q12H, start now CBC Q72H while on Lovenox, PRN BMET   Kean Gautreau D. Mina Marble, PharmD, BCPS, [place] 12/20/2018, 4:15 PM

## 2018-12-20 NOTE — Progress Notes (Signed)
Right upper extremity venous duplex completed. Prelimianry results in Chart review CV Proc. [place] Issam Carlyon,RVS 12/20/2018, 12:55 PM

## 2018-12-20 NOTE — Progress Notes (Signed)
Peripherally Inserted Central Catheter/Midline Placement  The IV Nurse has discussed with the patient and/or persons authorized to consent for the patient, the purpose of this procedure and the potential benefits and risks involved with this procedure.  The benefits include less needle sticks, lab draws from the catheter, and the patient may be discharged home with the catheter. Risks include, but not limited to, infection, bleeding, blood clot (thrombus formation), and puncture of an artery; nerve damage and irregular heartbeat and possibility to perform a PICC exchange if needed/ordered by physician.  Alternatives to this procedure were also discussed.  Bard Power PICC patient education guide, fact sheet on infection prevention and patient information card has been provided to patient /or left at bedside.  Previous PICC consent utilized.  PICC/Midline Placement Documentation  PICC Double Lumen 16/57/90 PICC Left Basilic 45 cm 1 cm (Active)  Indication for Insertion or Continuance of Line Vasoactive infusions;Chronic illness with exacerbations (CF, Sickle Cell, etc.);Prolonged intravenous therapies 12/20/2018  2:00 PM  Exposed Catheter (cm) 1 cm 12/20/2018  2:00 PM  Site Assessment Clean;Dry;Intact 12/20/2018  2:00 PM  Lumen #1 Status Flushed;Saline locked;Blood return noted 12/20/2018  2:00 PM  Lumen #2 Status Flushed;Saline locked;Blood return noted 12/20/2018  2:00 PM  Dressing Type Transparent 12/20/2018  2:00 PM  Dressing Status Clean;Dry;Intact;Antimicrobial disc in place 12/20/2018  2:00 PM  Line Care Connections checked and tightened 12/20/2018  2:00 PM  Line Adjustment (NICU/IV Team Only) No 12/20/2018  2:00 PM  Dressing Intervention New dressing 12/20/2018  2:00 PM  Dressing Change Due 12/27/18 12/20/2018  2:00 PM       Rolena Infante 12/20/2018, 2:07 PM

## 2018-12-21 LAB — CBC
HCT: 28.2 % — ABNORMAL LOW (ref 36.0–46.0)
Hemoglobin: 8.9 g/dL — ABNORMAL LOW (ref 12.0–15.0)
MCH: 30.4 pg (ref 26.0–34.0)
MCHC: 31.6 g/dL (ref 30.0–36.0)
MCV: 96.2 fL (ref 80.0–100.0)
Platelets: 232 10*3/uL (ref 150–400)
RBC: 2.93 MIL/uL — ABNORMAL LOW (ref 3.87–5.11)
RDW: 16.6 % — ABNORMAL HIGH (ref 11.5–15.5)
WBC: 6.9 10*3/uL (ref 4.0–10.5)
nRBC: 0 % (ref 0.0–0.2)

## 2018-12-21 LAB — GLUCOSE, CAPILLARY
Glucose-Capillary: 112 mg/dL — ABNORMAL HIGH (ref 70–99)
Glucose-Capillary: 115 mg/dL — ABNORMAL HIGH (ref 70–99)
Glucose-Capillary: 95 mg/dL (ref 70–99)
Glucose-Capillary: 105 mg/dL — ABNORMAL HIGH (ref 70–99)
Glucose-Capillary: 90 mg/dL (ref 70–99)
Glucose-Capillary: 92 mg/dL (ref 70–99)

## 2018-12-21 MED ORDER — SODIUM CHLORIDE 0.9 % IV SOLN
INTRAVENOUS | Status: DC | PRN
  Administered 2018-12-21: 250 mL via INTRAVENOUS
  Administered 2018-12-25: 10:00:00 450 mL via INTRAVENOUS

## 2018-12-21 NOTE — Progress Notes (Signed)
Pt placed back on full support due to increase work of breathing and increased respiratory rate.  RT will continue to monitor.

## 2018-12-21 NOTE — Progress Notes (Signed)
Tele video call provided to 2 of the patients daughter, Hassan Rowan, and Horris Latino.

## 2018-12-21 NOTE — Progress Notes (Signed)
NAME:  Holly Cooper, MRN:  [id number], DOB:  16-Oct-1933, LOS: 9 ADMISSION DATE:  12/12/2018,  CHIEF COMPLAINT:  Seizures, Acute resp failure   Brief History   85 year old woman, history of CVA, history DVT/PE on Eliquis (question compliance). Experienced recurrent seizures that were treated initially with medazepam and then with Keppra.  She was intubated for failure to protect her airway.  Transfer is now to [facility] for [facility] evaluation mechanically ventilated. Placed on pressure support this am.     Past Medical History  CVA,DVT/ PE,Hyperlipidemia,obesity  Significant Hospital Events   3/28 - loaded with fosphenytoin  3/29 following commands off sedation. Cont EEG continued. Unable to get LP. Started on empiric coverage for HSV encephalitis as well as bacterial coverage 3/30: Interventional radiology unable to do lumbar puncture due to inability lay patient in the prone position 3/31: Hemodynamically stable.  No seizures overnight.  Discontinuing Versed infusion 4/1: Ordering MRI 4/2: MRI back, no acute abnormalities.  Lower extremities Dopplers both negative for DVT.  More awake tolerating pressure support ventilation stopping treatment dose low molecular weight heparin continuing supportive care stopping antimicrobial therapy continuing acyclovir with presumptive diagnosis of HSV encephalitis 4/3: Now following commands.  Pressure support ventilation and spontaneous breathing trial attempted.  Assessing for extubation 4/4:Tolerating fairly well but with low tvs (250-350 cc). I dont believe that her current respiratory effort is adequate for extubation. 4/5 PS trials on 12 cm H2O with TV 300-375 rr 25.  Better but still not able to extubate.  Will repeat labs, CXR in the am. Consults:  Neurology  Procedures:  ETT 3/27 >>  PICC line left upper extremity 3/29 Significant Diagnostic Tests:  Head CT/ CTA 3/27 >> no evidence CVA, no evidence of large vessel occlusion Lower extremity  ultrasound 4/1: Negative for DVT bilaterally MRI brain 4/1: Motion degraded.  Chronic small vessel ischemic disease with chronic traumatic cerebellar infarcts no acute intracranial abnormalities Micro Data:    Antimicrobials:  vanc 3/29>>> 4/2 CTX 3/29>>> 4/2 Amp 3/29>>> 4/2 Acyclovir 3/29>>>  Interim history/subjective:  Much more awake  Objective   Blood pressure (!) 92/42, pulse 75, temperature 99.4 F (37.4 C), temperature source Axillary, resp. rate 14, height 5\' 6"  (1.676 m), weight 87.7 kg, SpO2 100 %.    Vent Mode: CPAP;PSV FiO2 (%):  [40 %] 40 % Set Rate:  [14 bmp] 14 bmp Vt Set:  [470 mL] 470 mL PEEP:  [5 cmH20] 5 cmH20 Pressure Support:  [12 cmH20] 12 cmH20 Plateau Pressure:  [16 cmH20-20 cmH20] 16 cmH20   Intake/Output Summary (Last 24 hours) at 12/21/2018 0820 Last data filed at 12/21/2018 0700 Gross per 24 hour  Intake 1162.44 ml  Output 1669 ml  Net -506.56 ml    Examination:  General: Is 85 year old female currently awake, interactive, following commands this morning HEENT normocephalic atraumatic orally intubated jugular venous distention absent mucous membranes are moist Pulmonary: Clear.  Diminished left greater than right but no accessory use.  Tidal volumes in the low 300s on pressure support of 5 Cardiac: 3 out of 6 systolic murmur consistent with aortic stenosis this radiates to the left carotid Abdomen: Soft nontender no organomegaly Extremities: Warm and dry, has dependent edema bilaterally in the upper R> left extremities there are scattered areas of ecchymosis Neuro: Awake, following commands today, much more interactive GU: Clear yellow  Resolved Hospital Problem list   Leukocytosis  History of treated DVT/PE completed 3 months of anticoagulation Assessment & Plan:   Acute encephalopathy (working  dx being 2/2 HSV encephalitis) Status epilepticus No further seizure activity More awake and appropriate as of 4/2 Plan Day #6 of 14 acyclovir   Continuing AEDs indefinitely  Will need neurology follow-up 2 to 4 weeks after discharge   Acute respiratory failure due to failure to protect airway, requiring intubation and mechanical ventilation -mental status is barrier to extubation, mental status improved Plan Pressure support ventilation as tolerated, currently on spontaneous breathing trial will continue ongoing pressure support Continue VAP bundle Gentle diuresis  Anemia Plan Trend CBC  Fluid and electrolyte imbalance: Hypokalemia Plan Replace and recheck as indicated  History hypertension Plan Holding antihypertensives  Nutrition Plan Continue tube feeds  Best practice:  Diet: TF start 3/28 Pain/Anxiety/Delirium protocol (if indicated): Intermittent Versed, fentanyl VAP protocol (if indicated): Ordered 3/27 DVT prophylaxis: SCD GI prophylaxis: Protonix Glucose control: NA Mobility: Bedrest Code Status: Full code Family Communication: No family present currently Disposition: She remains critically ill due to her need for titration and ongoing assessment of her ventilatory needs.  Will reevaluate later today for possible extubation, getting close to a trial of extubation.    My critical care x33 minutes   Erick Colace ACNP-BC [place] Pager # [phone] OR # [phone] if no answer

## 2018-12-21 NOTE — Plan of Care (Signed)
  Problem: Clinical Measurements: °Goal: Ability to maintain clinical measurements within normal limits will improve °Outcome: Progressing °  °Problem: Elimination: °Goal: Will not experience complications related to bowel motility °Outcome: Progressing °Goal: Will not experience complications related to urinary retention °Outcome: Progressing °  °

## 2018-12-22 ENCOUNTER — Inpatient Hospital Stay (HOSPITAL_COMMUNITY): Payer: Medicare Other

## 2018-12-22 LAB — GLUCOSE, CAPILLARY
Glucose-Capillary: 106 mg/dL — ABNORMAL HIGH (ref 70–99)
Glucose-Capillary: 95 mg/dL (ref 70–99)
Glucose-Capillary: 92 mg/dL (ref 70–99)
Glucose-Capillary: 86 mg/dL (ref 70–99)
Glucose-Capillary: 79 mg/dL (ref 70–99)
Glucose-Capillary: 99 mg/dL (ref 70–99)

## 2018-12-22 LAB — COMPREHENSIVE METABOLIC PANEL
ALT: 88 U/L — ABNORMAL HIGH (ref 0–44)
AST: 111 U/L — ABNORMAL HIGH (ref 15–41)
Albumin: 1.6 g/dL — ABNORMAL LOW (ref 3.5–5.0)
Alkaline Phosphatase: 61 U/L (ref 38–126)
Anion gap: 9 (ref 5–15)
BUN: 28 mg/dL — ABNORMAL HIGH (ref 8–23)
CO2: 27 mmol/L (ref 22–32)
Calcium: 8.5 mg/dL — ABNORMAL LOW (ref 8.9–10.3)
Chloride: 102 mmol/L (ref 98–111)
Creatinine, Ser: 0.53 mg/dL (ref 0.44–1.00)
GFR calc Af Amer: >60 mL/min (ref >60)
GFR calc non Af Amer: >60 mL/min (ref >60)
Glucose, Bld: 120 mg/dL — ABNORMAL HIGH (ref 70–99)
Potassium: 3.4 mmol/L — ABNORMAL LOW (ref 3.5–5.1)
Sodium: 138 mmol/L (ref 135–145)
Total Bilirubin: 0.3 mg/dL (ref 0.3–1.2)
Total Protein: 5.3 g/dL — ABNORMAL LOW (ref 6.5–8.1)

## 2018-12-22 LAB — CBC WITH DIFFERENTIAL/PLATELET
Abs Immature Granulocytes: 0.05 10*3/uL (ref 0.00–0.07)
Basophils Absolute: 0 10*3/uL (ref 0.0–0.1)
Basophils Relative: 0 %
Eosinophils Absolute: 0.3 10*3/uL (ref 0.0–0.5)
Eosinophils Relative: 6 %
HCT: 28.5 % — ABNORMAL LOW (ref 36.0–46.0)
Hemoglobin: 8.8 g/dL — ABNORMAL LOW (ref 12.0–15.0)
Immature Granulocytes: 1 %
Lymphocytes Relative: 22 %
Lymphs Abs: 1.2 10*3/uL (ref 0.7–4.0)
MCH: 29.7 pg (ref 26.0–34.0)
MCHC: 30.9 g/dL (ref 30.0–36.0)
MCV: 96.3 fL (ref 80.0–100.0)
Monocytes Absolute: 0.4 10*3/uL (ref 0.1–1.0)
Monocytes Relative: 8 %
Neutro Abs: 3.3 10*3/uL (ref 1.7–7.7)
Neutrophils Relative %: 63 %
Platelets: 272 10*3/uL (ref 150–400)
RBC: 2.96 MIL/uL — ABNORMAL LOW (ref 3.87–5.11)
RDW: 16.5 % — ABNORMAL HIGH (ref 11.5–15.5)
WBC: 5.3 10*3/uL (ref 4.0–10.5)
nRBC: 0 % (ref 0.0–0.2)

## 2018-12-22 MED ORDER — POTASSIUM CHLORIDE 20 MEQ/15ML (10%) PO SOLN
20.0000 meq | Freq: Three times a day (TID) | ORAL | Status: DC
  Administered 2018-12-22 – 2018-12-26 (×13): 20 meq
  Filled 2018-12-22 (×12): qty 15

## 2018-12-22 MED ORDER — POTASSIUM CHLORIDE 20 MEQ/15ML (10%) PO SOLN
20.0000 meq | ORAL | Status: DC | PRN
  Filled 2018-12-22: qty 15

## 2018-12-22 NOTE — Progress Notes (Signed)
NAME:  Holly Cooper, MRN:  [id number], DOB:  07/27/1934, LOS: 77 ADMISSION DATE:  12/12/2018,  CHIEF COMPLAINT:  Seizures, Acute resp failure   Brief History   85 year old woman, history of CVA, history DVT/PE on Eliquis (question compliance). Experienced recurrent seizures that were treated initially with medazepam and then with Keppra.  She was intubated for failure to protect her airway.  Transfer is now to [facility] for further evaluation mechanically ventilated. Has been tolerating pressure support   Past Medical History  CVA,DVT/ PE,Hyperlipidemia,obesity  Significant Hospital Events   3/28 - loaded with fosphenytoin  3/29 following commands off sedation. Cont EEG continued. Unable to get LP. Started on empiric coverage for HSV encephalitis as well as bacterial coverage 3/30: Interventional radiology unable to do lumbar puncture due to inability lay patient in the prone position 3/31: Hemodynamically stable.  No seizures overnight.  Discontinuing Versed infusion 4/1: Ordering MRI 4/2: MRI back, no acute abnormalities.  Lower extremities Dopplers both negative for DVT.  More awake tolerating pressure support ventilation stopping treatment dose low molecular weight heparin continuing supportive care stopping antimicrobial therapy continuing acyclovir with presumptive diagnosis of HSV encephalitis 4/3: Now following commands.  Pressure support ventilation and spontaneous breathing trial attempted.  Assessing for extubation 4/4:Tolerating fairly well but with low tvs (250-350 cc). I dont believe that her current respiratory effort is adequate for extubation. 4/5 PS trials on 12 cm H2O with TV 300-375 rr 25.  Better but still not able to extubate.  Will repeat labs, CXR in the am. 4/6 on pressure support of 12/PEEP of 5 Consults:  Neurology  Procedures:  ETT 3/27 >>  PICC line left upper extremity 3/29 Significant Diagnostic Tests:  Head CT/ CTA 3/27 >> no evidence CVA, no evidence of  large vessel occlusion Lower extremity ultrasound 4/1: Negative for DVT bilaterally MRI brain 4/1: Motion degraded.  Chronic small vessel ischemic disease with chronic traumatic cerebellar infarcts no acute intracranial abnormalities Micro Data:    Antimicrobials:  vanc 3/29>>> 4/2 CTX 3/29>>> 4/2 Amp 3/29>>> 4/2 Acyclovir 3/29>>>  Interim history/subjective:  Much more awake, weak, follows commands  Objective   Blood pressure (!) 134/58, pulse 77, temperature 97.9 F (36.6 C), temperature source Oral, resp. rate 20, height 5\' 6"  (1.676 m), weight 86.7 kg, SpO2 100 %.    Vent Mode: CPAP;PSV FiO2 (%):  [40 %] 40 % Set Rate:  [14 bmp] 14 bmp Vt Set:  [470 mL] 470 mL PEEP:  [5 cmH20] 5 cmH20 Pressure Support:  [12 cmH20] 12 cmH20 Plateau Pressure:  [18 cmH20-25 cmH20] 21 cmH20   Intake/Output Summary (Last 24 hours) at 12/22/2018 1025 Last data filed at 12/22/2018 0800 Gross per 24 hour  Intake 1765.65 ml  Output 1595 ml  Net 170.65 ml    Examination: Elderly lady, does not appear to be in distress Orally intubated, moist oral mucosa Poor air movement bilaterally S1-S2 appreciated, systolic murmur at the base Abdomen is soft, nontender Extremities warm and dry, mild edema Awake, interactive, moves all extremities to [facility] Problem list   Leukocytosis  History of treated DVT/PE completed 3 months of anticoagulation Assessment & Plan:  Acute encephalopathy-working diagnosis of HSV encephalitis Status epilepticus No further seizure activity More awake and appropriate maintained from 4/2 Continue acyclovir-day 7 of 14 Continue AEDs indefinitely Neurology to follow-up  Acute respiratory failure due to failure to protect airway, required intubation and mechanical ventilation Mental status is bariatric sedation, mental status improved Plan Pressure  support ventilation as tolerated Continuing with spontaneous breathing trials which he is  tolerating at present Continue VAP bundle Gentle diuresis  Anemia Trend CBC  Fluid and electrolyte imbalance, hypokalemia Replete potassium  History of hypertension -Continue to hold antihypertensives  Nutrition Continue tube feeds.   Best practice:  Diet: TF start 3/28 Pain/Anxiety/Delirium protocol (if indicated): Intermittent Versed, fentanyl VAP protocol (if indicated): Ordered 3/27 DVT prophylaxis: SCD GI prophylaxis: Protonix Glucose control: NA Mobility: Bedrest Code Status: Full code Family Communication: No family present currently Disposition: Patient continues on pressure support ventilation, being titrated. Continues treatment for HSV encephalitis Tolerating pressure support -being weaned, if she continues to improve may be candidate for possible extubation.  Critical care time spent evaluating patient, reviewing records, formulating plan of care 31 minutes.  Adewale Olalere,MD.

## 2018-12-22 NOTE — Progress Notes (Signed)
11:30 Facilitated video phone call with pt, her son and her daughter.

## 2018-12-23 ENCOUNTER — Inpatient Hospital Stay (HOSPITAL_COMMUNITY): Payer: Medicare Other

## 2018-12-23 LAB — BASIC METABOLIC PANEL
Anion gap: 5 (ref 5–15)
BUN: 23 mg/dL (ref 8–23)
CO2: 29 mmol/L (ref 22–32)
Calcium: 8.6 mg/dL — ABNORMAL LOW (ref 8.9–10.3)
Chloride: 104 mmol/L (ref 98–111)
Creatinine, Ser: 0.54 mg/dL (ref 0.44–1.00)
GFR calc Af Amer: >60 mL/min (ref >60)
GFR calc non Af Amer: >60 mL/min (ref >60)
Glucose, Bld: 122 mg/dL — ABNORMAL HIGH (ref 70–99)
Potassium: 4.2 mmol/L (ref 3.5–5.1)
Sodium: 138 mmol/L (ref 135–145)

## 2018-12-23 LAB — GLUCOSE, CAPILLARY
Glucose-Capillary: 118 mg/dL — ABNORMAL HIGH (ref 70–99)
Glucose-Capillary: 84 mg/dL (ref 70–99)
Glucose-Capillary: 87 mg/dL (ref 70–99)
Glucose-Capillary: 95 mg/dL (ref 70–99)
Glucose-Capillary: 95 mg/dL (ref 70–99)
Glucose-Capillary: 99 mg/dL (ref 70–99)

## 2018-12-23 MED ORDER — FUROSEMIDE 10 MG/ML IJ SOLN
20.0000 mg | Freq: Once | INTRAMUSCULAR | Status: AC
  Administered 2018-12-23: 20 mg via INTRAVENOUS
  Filled 2018-12-23: qty 2

## 2018-12-23 NOTE — Progress Notes (Addendum)
NAME:  Holly Cooper, MRN:  [id number], DOB:  January 12, 1934, LOS: 52 ADMISSION DATE:  12/12/2018,  CHIEF COMPLAINT:  Seizures, Acute resp failure   Brief History   85 year old woman, history of CVA, history DVT/PE on Eliquis (question compliance). Experienced recurrent seizures that were treated initially with medazepam and then with Keppra.  She was intubated for failure to protect her airway.  Transfer is now to [facility] for further evaluation mechanically ventilated. Has been tolerating pressure support   Past Medical History  CVA,DVT/ PE,Hyperlipidemia,obesity  Significant Hospital Events   3/28 - loaded with fosphenytoin  3/29 following commands off sedation. Cont EEG continued. Unable to get LP. Started on empiric coverage for HSV encephalitis as well as bacterial coverage 3/30: Interventional radiology unable to do lumbar puncture due to inability lay patient in the prone position 3/31: Hemodynamically stable.  No seizures overnight.  Discontinuing Versed infusion 4/1: Ordering MRI 4/2: MRI back, no acute abnormalities.  Lower extremities Dopplers both negative for DVT.  More awake tolerating pressure support ventilation stopping treatment dose low molecular weight heparin continuing supportive care stopping antimicrobial therapy continuing acyclovir with presumptive diagnosis of HSV encephalitis 4/3: Now following commands.  Pressure support ventilation and spontaneous breathing trial attempted.  Assessing for extubation 4/4:Tolerating fairly well but with low tvs (250-350 cc). I dont believe that her current respiratory effort is adequate for extubation. 4/5 PS trials on 12 cm H2O with TV 300-375 rr 25.  Better but still not able to extubate.  Will repeat labs, CXR in the am. 4/6 on pressure support of 12/PEEP of 5 4/7 Weaning on 5/5, 30%, following commands Consults:  Neurology  Procedures:  ETT 3/27 >>  PICC line left upper extremity 3/29 Significant Diagnostic Tests:  Head CT/  CTA 3/27 >> no evidence CVA, no evidence of large vessel occlusion Lower extremity ultrasound 4/1: Negative for DVT bilaterally MRI brain 4/1: Motion degraded.  Chronic small vessel ischemic disease with chronic traumatic cerebellar infarcts no acute intracranial abnormalities Micro Data:    Antimicrobials:  vanc 3/29>>> 4/2 CTX 3/29>>> 4/2 Amp 3/29>>> 4/2 Acyclovir 3/29>>>  Interim history/subjective:  Awake, weak , follows commands T Max 100,  + 9500 cc's  NO CXR No sedation and weaning on 5/5, TV 300-350, no accessory muscle use, no diaphragmatic efforts   Objective   Blood pressure (!) 119/52, pulse 81, temperature 98.3 F (36.8 C), temperature source Axillary, resp. rate (!) 29, height 5\' 6"  (1.676 m), weight 87.2 kg, SpO2 100 %.    Vent Mode: CPAP;PSV FiO2 (%):  [30 %-40 %] 30 % Set Rate:  [14 bmp] 14 bmp Vt Set:  [470 mL] 470 mL PEEP:  [5 cmH20] 5 cmH20 Pressure Support:  [5 cmH20-10 cmH20] 5 cmH20 Plateau Pressure:  [20 cmH20-22 cmH20] 20 cmH20   Intake/Output Summary (Last 24 hours) at 12/23/2018 0849 Last data filed at 12/23/2018 0800 Gross per 24 hour  Intake 1977.77 ml  Output 1000 ml  Net 977.77 ml    Examination: Elderly lady, no distress, on CPAP PS NCAT, Orally intubated, feeding tube and ETT secure and intact, moist oral mucosa, No LAD Bilateral chest excursion, Few rhonchi, diminished per bases, No accessory muscle use, no diaphragmatic efforts S1-S2 appreciated, 3/6 systolic murmur at the base, No RG Abdomen is soft, nontender, ND, BS + Extremities warm and dry, mild edema Awake, interactive, moves all extremities to [facility] Problem list   Leukocytosis  History of treated DVT/PE completed 3 months of  anticoagulation Assessment & Plan:  Acute encephalopathy-working diagnosis of HSV encephalitis Status epilepticus No further seizure activity More awake and appropriate maintained from 4/2 Continue acyclovir-day 9 of 14  Continue AEDs indefinitely Neurology to follow-up  Acute respiratory failure due to failure to protect airway, required intubation and mechanical ventilation No sedation mental status improved + 9500 cc's RR up to 40's with stimulation Plan Pressure support ventilation as tolerated Continuing with spontaneous breathing trials which he is tolerating at present Continue VAP bundle Gentle diuresis CXR now Will assess need for additional diuresis to optimize pulmonary status for possible extubation. CXR in am and prn  Anemia Trend CBC No labs 4/7 CBC 4/8 ordered  Fluid and electrolyte imbalance, hypokalemia Replete potassium BMET now  Trend BMET Replete electrolytes as needed  History of hypertension -Continue to hold antihypertensives  Nutrition Continue tube feeds.   Best practice:  Diet: TF start 3/28 Pain/Anxiety/Delirium protocol (if indicated): Intermittent Versed, fentanyl VAP protocol (if indicated): Ordered 3/27 DVT prophylaxis: SCD GI prophylaxis: Protonix Glucose control: NA Mobility: Bedrest Code Status: Full code Family Communication: No family present currently, updates per nursing Disposition: Patient continues on pressure support ventilation, being titrated. Continues treatment for HSV encephalitis Tolerating pressure support -being weaned, if she continues to improve may be candidate for possible extubation.  APP CC time 30 minutes Magdalen Spatz, AGACNP-BC [place] Pager # [phone] If no answer [phone]  12/23/2018 9:12 AM

## 2018-12-23 NOTE — Progress Notes (Signed)
ANTICOAGULATION CONSULT NOTE Pharmacy Consult: Lovenox Indication:  Acute DVT  Allergies  Allergen Reactions  . Atorvastatin Other (See Comments)    Muscle aches - back     Patient Measurements: Height: 5\' 6"  (167.6 cm)(from 11/14/18 encounter) Weight: 192 lb 3.9 oz (87.2 kg) IBW/kg (Calculated) : 59.3  Vital Signs: Temp: 98.7 F (37.1 C) (04/07 0800) Temp Source: Axillary (04/07 0800) BP: 119/52 (04/07 0800) Pulse Rate: 81 (04/07 0800)  Labs: Recent Labs    12/21/18 0533 12/22/18 0631  HGB 8.9* 8.8*  HCT 28.2* 28.5*  PLT 232 272  CREATININE  --  0.53    Estimated Creatinine Clearance: 59.3 mL/min (by C-G formula based on SCr of 0.53 mg/dL).   Medical History: No past medical history on file.    Assessment: 35 YOF presented on 12/12/18 with seizure and acute respiratory failure.  Patient has a history of DVT/PE on Coumadin PTA, but patient's compliance was quesitonable.  INR was sub-therapeutic on admit.   Doppler  12/20/18 shows  acute DVT involving the right axillary/brachial veins and acute superficial vein thrombosis of right basilic/cephalic veins.    Continues on full dose Lovenox  Renal function, LFTs and CBC stable.  No bleeding reported.  Goal of Therapy:  Anti-Xa level 0.6-1 units/ml 4hrs after LMWH dose given Monitor platelets by anticoagulation protocol: Yes   Plan:  Continue Lovenox 85mg  SQ Q12H Restart warfarin?  Change seizure meds to per tube? CBC Q72H while on Lovenox, PRN BMET  Thank you Anette Guarneri, PharmD [phone]   12/23/2018, 9:09 AM

## 2018-12-24 ENCOUNTER — Inpatient Hospital Stay (HOSPITAL_COMMUNITY): Payer: Medicare Other

## 2018-12-24 DIAGNOSIS — I82A12 Acute embolism and thrombosis of left axillary vein: Secondary | ICD-10-CM

## 2018-12-24 LAB — CBC
HCT: 30.9 % — ABNORMAL LOW (ref 36.0–46.0)
Hemoglobin: 9.4 g/dL — ABNORMAL LOW (ref 12.0–15.0)
MCH: 29.3 pg (ref 26.0–34.0)
MCHC: 30.4 g/dL (ref 30.0–36.0)
MCV: 96.3 fL (ref 80.0–100.0)
Platelets: 317 10*3/uL (ref 150–400)
RBC: 3.21 MIL/uL — ABNORMAL LOW (ref 3.87–5.11)
RDW: 15.9 % — ABNORMAL HIGH (ref 11.5–15.5)
WBC: 5.9 10*3/uL (ref 4.0–10.5)
nRBC: 0 % (ref 0.0–0.2)

## 2018-12-24 LAB — GLUCOSE, CAPILLARY
Glucose-Capillary: 102 mg/dL — ABNORMAL HIGH (ref 70–99)
Glucose-Capillary: 115 mg/dL — ABNORMAL HIGH (ref 70–99)
Glucose-Capillary: 74 mg/dL (ref 70–99)
Glucose-Capillary: 80 mg/dL (ref 70–99)
Glucose-Capillary: 81 mg/dL (ref 70–99)
Glucose-Capillary: 92 mg/dL (ref 70–99)

## 2018-12-24 LAB — BASIC METABOLIC PANEL
Anion gap: 7 (ref 5–15)
BUN: 26 mg/dL — ABNORMAL HIGH (ref 8–23)
CO2: 28 mmol/L (ref 22–32)
Calcium: 8.9 mg/dL (ref 8.9–10.3)
Chloride: 100 mmol/L (ref 98–111)
Creatinine, Ser: 0.57 mg/dL (ref 0.44–1.00)
GFR calc Af Amer: >60 mL/min (ref >60)
GFR calc non Af Amer: >60 mL/min (ref >60)
Glucose, Bld: 123 mg/dL — ABNORMAL HIGH (ref 70–99)
Potassium: 4.4 mmol/L (ref 3.5–5.1)
Sodium: 135 mmol/L (ref 135–145)

## 2018-12-24 LAB — MAGNESIUM: Magnesium: 2 mg/dL (ref 1.7–2.4)

## 2018-12-24 LAB — PHOSPHORUS: Phosphorus: 3.7 mg/dL (ref 2.5–4.6)

## 2018-12-24 MED ORDER — FUROSEMIDE 10 MG/ML IJ SOLN
20.0000 mg | Freq: Once | INTRAMUSCULAR | Status: AC
  Administered 2018-12-24: 20 mg via INTRAVENOUS
  Filled 2018-12-24: qty 2

## 2018-12-24 NOTE — Progress Notes (Signed)
Patient extubated and 2L [place] placed. She is following minimal commands. Difficulty with speech at this time but is oriented to person. MD aware. Macyn Shropshire, Rande Brunt, RN

## 2018-12-24 NOTE — Progress Notes (Signed)
Tele-visit with grandaughter and son via E-link.

## 2018-12-24 NOTE — Procedures (Signed)
Extubation Procedure Note  Patient Details:   Name: MILLIE FORDE DOB: 1934/09/06 MRN: [id number]   Airway Documentation:    Vent end date: 12/24/18 Vent end time: 0830   Evaluation  O2 sats: stable throughout Complications: No apparent complications Patient did tolerate procedure well. Bilateral Breath Sounds: Clear   Yes   Patient extubated to 2lnc. Vital signs stable at this time. No complications. RN at bedside. RT will continue to monitor.  Lavella, Myren 12/24/2018, 8:33 AM

## 2018-12-24 NOTE — Progress Notes (Addendum)
NAME:  Holly Cooper, MRN:  [id number], DOB:  July 14, 1934, LOS: 71 ADMISSION DATE:  12/12/2018,  CHIEF COMPLAINT:  Seizures, Acute resp failure   Brief History   85 year old woman, history of CVA, history DVT/PE on Eliquis (question compliance). Experienced recurrent seizures that were treated initially with medazepam and then with Keppra.  She was intubated for failure to protect her airway.  Transfer is now to [facility] for further evaluation mechanically ventilated. Has been tolerating pressure support   Past Medical History  CVA,DVT/ PE,Hyperlipidemia,obesity  Significant Hospital Events   3/28 - loaded with fosphenytoin  3/29 following commands off sedation. Cont EEG continued. Unable to get LP. Started on empiric coverage for HSV encephalitis as well as bacterial coverage 3/30: Interventional radiology unable to do lumbar puncture due to inability lay patient in the prone position 3/31: Hemodynamically stable.  No seizures overnight.  Discontinuing Versed infusion 4/1: Ordering MRI 4/2: MRI back, no acute abnormalities.  Lower extremities Dopplers both negative for DVT.  More awake tolerating pressure support ventilation stopping treatment dose low molecular weight heparin continuing supportive care stopping antimicrobial therapy continuing acyclovir with presumptive diagnosis of HSV encephalitis 4/3: Now following commands.  Pressure support ventilation and spontaneous breathing trial attempted.  Assessing for extubation 4/4:Tolerating fairly well but with low tvs (250-350 cc). I dont believe that her current respiratory effort is adequate for extubation. 4/5 PS trials on 12 cm H2O with TV 300-375 rr 25.  Better but still not able to extubate.  Will repeat labs, CXR in the am. 4/6 on pressure support of 12/PEEP of 5 4/7 Weaning on 5/5, 30%, following commands 4/8 on pressure support doing well Consults:  Neurology  Procedures:  ETT 3/27 >>  PICC line left upper extremity 3/29   Significant Diagnostic Tests:  Head CT/ CTA 3/27 >> no evidence CVA, no evidence of large vessel occlusion Lower extremity ultrasound 4/1: Negative for DVT bilaterally MRI brain 4/1: Motion degraded.  Chronic small vessel ischemic disease with chronic traumatic cerebellar infarcts no acute intracranial abnormalities 9 Micro Data:    Antimicrobials:  vanc 3/29>>> 4/2 CTX 3/29>>> 4/2 Amp 3/29>>> 4/2 Acyclovir 3/29>>>  Interim history/subjective:  Awake, weak , follows commands T Max 100,  + 9500 cc's  NO CXR No sedation and weaning on 5/5, TV 300-350, no accessory muscle use, no diaphragmatic efforts   Objective   Blood pressure (!) 115/102, pulse 80, temperature 98.8 F (37.1 C), temperature source Axillary, resp. rate (!) 26, height 5\' 6"  (1.676 m), weight 86 kg, SpO2 100 %.    Vent Mode: CPAP;PSV FiO2 (%):  [30 %] 30 % Set Rate:  [14 bmp] 14 bmp Vt Set:  [470 mL] 470 mL PEEP:  [5 cmH20] 5 cmH20 Pressure Support:  [5 cmH20-8 cmH20] 8 cmH20 Plateau Pressure:  [18 cmH20-19 cmH20] 19 cmH20   Intake/Output Summary (Last 24 hours) at 12/24/2018 0102 Last data filed at 12/24/2018 0700 Gross per 24 hour  Intake 1621.48 ml  Output 2100 ml  Net -478.52 ml    Examination: Elderly lady, not in distress, on pressure support and tolerating it well Orally intubated Moist oral mucosa Chest with equal movement, few rhonchi at bases S1-S2 appreciated Systolic murmur at the base Abdomen is soft nontender Extremities is warm and dry Interactive, follows commands  Resolved Hospital Problem list   Leukocytosis  History of treated DVT/PE completed 3 months of anticoagulation  Assessment & Plan:   Acute encephalopathy-working diagnosis of HSV encephalitis Status epilepticus No further  seizure activity More awake and interactive She is on acyclovir day 10 of 14 Continue AEDs indefinitely Neurology to follow-up  Acute respiratory failure due to failure to protect airway,  required intubation and mechanical ventilation She is off sedation and following commands She has tolerated CPAP pressure support for the last 48 hours Has remained comfortable on pressure support NIF of -30  Acute deep vein thrombosis On Lovenox  We will extubate today  Anemia Trend CBC  Fluid and electrolyte imbalance, hypokalemia Being repleted Trend demerits  History of hypertension Continue to hold antihypertensives  Hold tube feeds for extubation today  Consult speech for swallowing evaluation Consult physical therapy   Best practice:  Diet: TF start 3/28 Pain/Anxiety/Delirium protocol (if indicated): Intermittent Versed, fentanyl VAP protocol (if indicated): Ordered 3/27 DVT prophylaxis: SCD GI prophylaxis: Protonix  Glucose control: NA Mobility: Bedrest Code Status: Full code Family Communication: No family present currently, updates per nursing Disposition: Patient continues on pressure support ventilation, being titrated. Continues treatment for HSV encephalitis  For extubation today  Critical care time spent evaluating patient reviewing records, formulating plan of [place] minutes  Sherrilyn Rist, MD Cell: [phone] 12/24/2018 8:24 AM

## 2018-12-24 NOTE — Progress Notes (Signed)
Rehab Admissions Coordinator Note:  Patient was screened by Cleatrice Burke for appropriateness for an Inpatient Acute Rehab Consult per PT recommendation.   At this time, we are recommending Inpatient Rehab consult. Please place order if you would like pt considered for possible admit.  Cleatrice Burke RN MSN 12/24/2018, 3:24 PM  I can be reached at [phone].

## 2018-12-24 NOTE — Evaluation (Signed)
Physical Therapy Evaluation Patient Details Name: Holly Cooper MRN: [id number] DOB: 05/17/1934 Today's Date: 12/24/2018   History of Present Illness  Pt is a 85 y.o. 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 Also found to have RUE DVT.  Clinical Impression  Pt evaluated post extubation 4/8. Pt unable to provide PLOF or home living situation; per chart review uses walker at baseline. Pt presenting with decreased functional mobility secondary to debility/deconditioning, balance impairments, decreased activity tolerance, communication and cognitive deficits. Pt requiring max assist for initiation of bed mobility and able to stand with two person moderate assistance. Unable to ambulate currently. Suspect pt will progress steadily and will need aggressive rehab for strengthening, therefore, recommending CIR to maximize functional independence.     Follow Up Recommendations CIR    Equipment Recommendations  Other (comment)(TBD)    Recommendations for Other Services Rehab consult     Precautions / Restrictions Precautions Precautions: Fall Restrictions Weight Bearing Restrictions: No      Mobility  Bed Mobility Overal bed mobility: Needs Assistance Bed Mobility: Supine to Sit;Sit to Supine     Supine to sit: Max assist Sit to supine: Max assist;+2 for physical assistance   General bed mobility comments: MaxA for initiation   Transfers Overall transfer level: Needs assistance Equipment used: Rolling walker (2 wheeled) Transfers: Sit to/from Stand Sit to Stand: Mod assist;+2 physical assistance         General transfer comment: ModA + 2 to stand edge of bed with trunk flexed posture. Unable to take side steps at edge of bed  Ambulation/Gait                Stairs            Wheelchair Mobility    Modified Rankin (Stroke Patients Only)       Balance  Overall balance assessment: Needs assistance Sitting-balance support: Feet supported Sitting balance-Leahy Scale: Fair Sitting balance - Comments: occasional right lateral lean, but otherwise requiring supervision for balance     Standing balance-Leahy Scale: Poor                               Pertinent Vitals/Pain Pain Assessment: Faces Faces Pain Scale: Hurts little more Pain Location: back Pain Descriptors / Indicators: Guarding;Grimacing Pain Intervention(s): Monitored during session;Repositioned   VSS on 2L O2    Home Living Family/patient expects to be discharged to:: Unsure                 Additional Comments: Pt unable to provide home living set up    Prior Function Level of Independence: Needs assistance   Gait / Transfers Assistance Needed: uses walker per chart review           Hand Dominance        Extremity/Trunk Assessment   Upper Extremity Assessment Upper Extremity Assessment: Defer to OT evaluation    Lower Extremity Assessment Lower Extremity Assessment: Generalized weakness;RLE deficits/detail RLE Deficits / Details: Right foot inverted at rest; able to achieve neutral with repositioning       Communication   Communication: Expressive difficulties(one word responses, hypophonic)  Cognition Arousal/Alertness: Awake/alert Behavior During Therapy: WFL for tasks assessed/performed Overall Cognitive Status: Impaired/Different from baseline Area of Impairment: Orientation;Attention;Memory;Following commands;Safety/judgement;Awareness                 Orientation  Level: Disoriented to;Place;Time;Situation Current Attention Level: Focused Memory: Decreased short-term memory Following Commands: Follows one step commands inconsistently;Follows one step commands with increased time Safety/Judgement: Decreased awareness of deficits Awareness: Intellectual   General Comments: Very pleasant, smiles and engages with "classic  country," music. Answers ~50% of questions appropriately with one word verbalizations       General Comments      Exercises     Assessment/Plan    PT Assessment Patient needs continued PT services  PT Problem List Decreased strength;Decreased range of motion;Decreased activity tolerance;Decreased balance;Decreased mobility;Decreased coordination;Decreased cognition;Decreased safety awareness       PT Treatment Interventions DME instruction;Gait training;Functional mobility training;Therapeutic activities;Therapeutic exercise;Balance training;Neuromuscular re-education;Patient/family education;Cognitive remediation    PT Goals (Current goals can be found in the Care Plan section)  Acute Rehab PT Goals Patient Stated Goal: none stated; agreeable to therapy PT Goal Formulation: With patient Time For Goal Achievement: 01/07/19 Potential to Achieve Goals: Good    Frequency Min 3X/week   Barriers to discharge        Co-evaluation               AM-PAC PT "6 Clicks" Mobility  Outcome Measure   Help needed moving from lying on your back to sitting on the side of a flat bed without using bedrails?: Total Help needed moving to and from a bed to a chair (including a wheelchair)?: Total Help needed standing up from a chair using your arms (e.g., wheelchair or bedside chair)?: A Lot Help needed to walk in hospital room?: Total Help needed climbing 3-5 steps with a railing? : Total 6 Click Score: 6    End of Session Equipment Utilized During Treatment: Gait belt;Oxygen Activity Tolerance: Patient tolerated treatment well Patient left: in bed;with bed alarm set;with call bell/phone within reach Nurse Communication: Mobility status PT Visit Diagnosis: Unsteadiness on feet (R26.81);Other abnormalities of gait and mobility (R26.89);Muscle weakness (generalized) (M62.81);Difficulty in walking, not elsewhere classified (R26.2)    Time: 1331-1406 PT Time Calculation (min) (ACUTE  ONLY): 35 min   Charges:   PT Evaluation $PT Eval Moderate Complexity: 1 Mod PT Treatments $Therapeutic Activity: 8-22 mins      Ellamae Sia, PT, DPT Acute Rehabilitation Services Pager [phone] Office [phone]   Willy Eddy 12/24/2018, 3:10 PM

## 2018-12-24 NOTE — Evaluation (Signed)
Clinical/Bedside Swallow Evaluation Patient Details  Name: Holly Cooper MRN: [id number] Date of Birth: November 13, 1933  Today's Date: 12/24/2018 Time: SLP Start Time (ACUTE ONLY): 1440 SLP Stop Time (ACUTE ONLY): 1452 SLP Time Calculation (min) (ACUTE ONLY): 12 min  Past Medical History: No past medical history on file. Marland Kitchen HPI:  85 year old woman, history of CVA, history DVT/PE on Eliquis (question compliance). Experienced recurrent seizures that were treated initially with medazepam and then with Keppra.  She was intubated for failure to protect her airway 3/28-4/8. MRI negative for acute infarct, per MD: Acute encephalopathy-working diagnosis of HSV encephalitis.    Assessment / Plan / Recommendation Clinical Impression  Pt demonstrates signs of acute reversible dysphagia following 12 day intubation, complicated by generalized weakness, confusion and presence of NG tube. Trials followed by delayed weak coughing and wet vocal quality. Pt not ready for PO today, will plan to f/u for readiness for diet/instrumental testing.   SLP Visit Diagnosis: Dysphagia, oropharyngeal phase (R13.12)    Aspiration Risk  Severe aspiration risk    Diet Recommendation NPO        Other  Recommendations Oral Care Recommendations: Oral care QID   Follow up Recommendations Skilled Nursing facility      Frequency and Duration min 2x/week  2 weeks       Prognosis Prognosis for Safe Diet Advancement: Good      Swallow Study   General HPI: 85 year old woman, history of CVA, history DVT/PE on Eliquis (question compliance). Experienced recurrent seizures that were treated initially with medazepam and then with Keppra.  She was intubated for failure to protect her airway 3/28-4/8. MRI negative for acute infarct, per MD: Acute encephalopathy-working diagnosis of HSV encephalitis.  Type of Study: Bedside Swallow Evaluation Diet Prior to this Study: NPO;NG Tube Respiratory Status: Nasal cannula History of  Recent Intubation: Yes Length of Intubations (days): 12 days Date extubated: 12/24/18 Behavior/Cognition: Alert Self-Feeding Abilities: Total assist Patient Positioning: Upright in bed Baseline Vocal Quality: Low vocal intensity;Hoarse Volitional Cough: Congested;Wet    Oral/Motor/Sensory Function Overall Oral Motor/Sensory Function: Generalized oral weakness   Ice Chips Ice chips: Impaired Presentation: Spoon Oral Phase Functional Implications: Prolonged oral transit Pharyngeal Phase Impairments: Suspected delayed Swallow;Cough - Delayed   Thin Liquid Thin Liquid: Impaired Presentation: Cup Pharyngeal  Phase Impairments: Cough - Delayed    Nectar Thick Nectar Thick Liquid: Not tested   Honey Thick Honey Thick Liquid: Not tested   Puree Puree: Impaired Pharyngeal Phase Impairments: Wet Vocal Quality   Solid     Solid: Not tested     Herbie Baltimore, MA CCC-SLP  Acute Rehabilitation Services Pager [phone] Office [phone]  Lynann Beaver 12/24/2018,3:07 PM

## 2018-12-25 ENCOUNTER — Inpatient Hospital Stay (HOSPITAL_COMMUNITY): Payer: Medicare Other

## 2018-12-25 LAB — PHOSPHORUS
Phosphorus: 4.4 mg/dL (ref 2.5–4.6)
Phosphorus: 3.8 mg/dL (ref 2.5–4.6)

## 2018-12-25 LAB — MAGNESIUM
Magnesium: 2 mg/dL (ref 1.7–2.4)
Magnesium: 2.1 mg/dL (ref 1.7–2.4)

## 2018-12-25 LAB — GLUCOSE, CAPILLARY
Glucose-Capillary: 84 mg/dL (ref 70–99)
Glucose-Capillary: 81 mg/dL (ref 70–99)
Glucose-Capillary: 69 mg/dL — ABNORMAL LOW (ref 70–99)
Glucose-Capillary: 84 mg/dL (ref 70–99)
Glucose-Capillary: 79 mg/dL (ref 70–99)
Glucose-Capillary: 75 mg/dL (ref 70–99)
Glucose-Capillary: 92 mg/dL (ref 70–99)

## 2018-12-25 MED ORDER — CHLORHEXIDINE GLUCONATE 0.12 % MT SOLN
15.0000 mL | Freq: Two times a day (BID) | OROMUCOSAL | Status: DC
  Administered 2018-12-25 – 2018-12-30 (×10): 15 mL via OROMUCOSAL
  Filled 2018-12-25 (×8): qty 15

## 2018-12-25 MED ORDER — VITAL HIGH PROTEIN PO LIQD
1000.0000 mL | ORAL | Status: DC
  Administered 2018-12-25: 10:00:00

## 2018-12-25 MED ORDER — ORAL CARE MOUTH RINSE
15.0000 mL | Freq: Two times a day (BID) | OROMUCOSAL | Status: DC
  Administered 2018-12-25 – 2018-12-30 (×11): 15 mL via OROMUCOSAL

## 2018-12-25 MED ORDER — LABETALOL HCL 5 MG/ML IV SOLN
INTRAVENOUS | Status: AC
  Filled 2018-12-25: qty 4

## 2018-12-25 MED ORDER — LABETALOL HCL 5 MG/ML IV SOLN
10.0000 mg | INTRAVENOUS | Status: DC | PRN
  Administered 2018-12-25: 18:00:00 10 mg via INTRAVENOUS
  Filled 2018-12-25: qty 4

## 2018-12-25 MED ORDER — ALTEPLASE 2 MG IJ SOLR
2.0000 mg | Freq: Once | INTRAMUSCULAR | Status: AC
  Administered 2018-12-25: 10:00:00 2 mg

## 2018-12-25 MED ORDER — AMLODIPINE BESYLATE 5 MG PO TABS
5.0000 mg | ORAL_TABLET | Freq: Every day | ORAL | Status: DC
  Administered 2018-12-26 – 2018-12-27 (×2): 5 mg
  Filled 2018-12-25 (×3): qty 1

## 2018-12-25 MED ORDER — AMLODIPINE 1 MG/ML ORAL SUSPENSION: 5.0000 mg | Freq: Every day | ORAL | Status: DC

## 2018-12-25 NOTE — Evaluation (Signed)
Occupational Therapy Evaluation Patient Details Name: Holly Cooper MRN: [id number] DOB: [birth year] Today's Date: 12/25/2018    History of Present Illness Pt is a 85 y.o. 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 Also found to have RUE DVT.   Clinical Impression   This 85 y/o female presents with the above. Per chart review pt using RW for mobility PTA, though unsure of PLOF for ADL (noted per chart pt does not have active phone number). Pt presenting with impaired cognition, decreased sitting and standing balance, increased weakness. Pt requiring two person assist for bed mobility and for safe completion of stand pivot transfers. She currently requires ModA for grooming and UB ADL, max-totalA (+2) for LB ADL. Pt requires increased time and intermittent multimodal cues to follow simple commands this session. She will benefit from continued acute OT services and recommend follow up therapy services in SNF setting after discharge to maximize her safety and independence with ADL and mobility. Will follow.     Follow Up Recommendations  SNF;Supervision/Assistance - 24 hour    Equipment Recommendations  3 in 1 bedside commode;Other (comment)(TBA)           Precautions / Restrictions Precautions Precautions: Fall Restrictions Weight Bearing Restrictions: No      Mobility Bed Mobility Overal bed mobility: Needs Assistance Bed Mobility: Rolling;Sidelying to Sit Rolling: Max assist;+2 for safety/equipment Sidelying to sit: Max assist;+2 for physical assistance;+2 for safety/equipment       General bed mobility comments: assist for rolling to L/R with cues provided to reach with appropriate UE to self-assist, assist for LEs over EOB and to elevate trunk  Transfers Overall transfer level: Needs assistance Equipment used: 2 person hand held assist Transfers: Sit to/from  Stand;Stand Pivot Transfers Sit to Stand: +2 physical assistance;+2 safety/equipment;Max assist Stand pivot transfers: Max assist;+2 physical assistance;+2 safety/equipment       General transfer comment: use of two person face to face method, max multimodal cues to have pt hold onto therapist's arms; requires facilitation/assist to achieve full upright standing posture and to facilitate stepping LEs to turn/pivot towards recliner, pivoting towards the R side     Balance Overall balance assessment: Needs assistance Sitting-balance support: Feet supported Sitting balance-Leahy Scale: Fair Sitting balance - Comments: occasional right lateral lean, able to maintain with minguard assist briefly progressed to min/modA once fatigued     Standing balance-Leahy Scale: Poor Standing balance comment: reliant on external support                           ADL either performed or assessed with clinical judgement   ADL Overall ADL's : Needs assistance/impaired Eating/Feeding: Set up;Minimal assistance;Sitting   Grooming: Minimal assistance;Moderate assistance;Sitting;Brushing hair Grooming Details (indicate cue type and reason): hand over hand assist to initiate task and support provided at elbow so that pt can reach her head, pt able to brush the front with assist to support UE and requires assist to brush the back Upper Body Bathing: Minimal assistance;Moderate assistance;Sitting   Lower Body Bathing: Maximal assistance;+2 for physical assistance;+2 for safety/equipment;Sit to/from stand   Upper Body Dressing : Moderate assistance;Sitting Upper Body Dressing Details (indicate cue type and reason): assist to don new gown Lower Body Dressing: Maximal assistance;Total assistance;+2 for physical assistance;+2 for safety/equipment;Sit to/from stand   Toilet Transfer: Maximal assistance;+2 for physical assistance;+2 for safety/equipment;Stand-pivot Toilet Transfer Details (indicate  cue  type and reason): simulated via transfer to [place] and Hygiene: Total assistance;+2 for physical assistance;+2 for safety/equipment;Bed level Toileting - Clothing Manipulation Details (indicate cue type and reason): pt incontinent of bowel start of session; requires totalA+2 for rolling L/R and for peri-care      Functional mobility during ADLs: Maximal assistance;+2 for physical assistance;+2 for safety/equipment       Vision         Perception     Praxis      Pertinent Vitals/Pain Pain Assessment: Faces Faces Pain Scale: No hurt Pain Intervention(s): Monitored during session     Hand Dominance Right   Extremity/Trunk Assessment Upper Extremity Assessment Upper Extremity Assessment: Generalized weakness   Lower Extremity Assessment Lower Extremity Assessment: Defer to PT evaluation       Communication Communication Communication: Expressive difficulties(one word responses, hypophonic)   Cognition Arousal/Alertness: Awake/alert Behavior During Therapy: WFL for tasks assessed/performed;Impulsive Overall Cognitive Status: Impaired/Different from baseline Area of Impairment: Orientation;Attention;Memory;Following commands;Safety/judgement;Awareness                 Orientation Level: Disoriented to;Place;Time;Situation Current Attention Level: Focused Memory: Decreased short-term memory Following Commands: Follows one step commands inconsistently;Follows one step commands with increased time Safety/Judgement: Decreased awareness of deficits Awareness: Intellectual   General Comments: Very pleasant, smiles in response to therapist's questions. Answers ~50% of questions appropriately with one word verbalizations; pt somewhat impulsive and requires cues for safety as she attempts to stand multiple times prior to having lines/etc ready   General Comments  VSS    Exercises     Shoulder Instructions      Home Living  Family/patient expects to be discharged to:: Unsure                                 Additional Comments: Pt unable to provide home living set up      Prior Functioning/Environment Level of Independence: Needs assistance  Gait / Transfers Assistance Needed: uses walker per chart review              OT Problem List: Decreased strength;Decreased range of motion;Decreased activity tolerance;Impaired balance (sitting and/or standing);Decreased cognition;Decreased safety awareness;Decreased knowledge of use of DME or AE;Decreased knowledge of precautions      OT Treatment/Interventions: Self-care/ADL training;Therapeutic exercise;Neuromuscular education;DME and/or AE instruction;Therapeutic activities;Patient/family education;Balance training;Cognitive remediation/compensation    OT Goals(Current goals can be found in the care plan section) Acute Rehab OT Goals Patient Stated Goal: none stated; agreeable to therapy OT Goal Formulation: With patient Time For Goal Achievement: 01/08/19 Potential to Achieve Goals: Good  OT Frequency: Min 2X/week   Barriers to D/C:            Co-evaluation PT/OT/SLP Co-Evaluation/Treatment: Yes Reason for Co-Treatment: Complexity of the patient's impairments (multi-system involvement);Necessary to address cognition/behavior during functional activity;For patient/therapist safety   OT goals addressed during session: ADL's and self-care;Strengthening/ROM      AM-PAC OT "6 Clicks" Daily Activity     Outcome Measure Help from another person eating meals?: A Little Help from another person taking care of personal grooming?: A Lot Help from another person toileting, which includes using toliet, bedpan, or urinal?: Total Help from another person bathing (including washing, rinsing, drying)?: A Lot Help from another person to put on and taking off regular upper body clothing?: A Lot Help from another person to put on and taking off regular  lower body  clothing?: Total 6 Click Score: 11   End of Session Equipment Utilized During Treatment: Gait belt Nurse Communication: Mobility status  Activity Tolerance: Patient tolerated treatment well Patient left: in chair;with call bell/phone within reach;with chair alarm set  OT Visit Diagnosis: Muscle weakness (generalized) (M62.81);Other symptoms and signs involving cognitive function                Time: 6002-9847 OT Time Calculation (min): 30 min Charges:  OT General Charges $OT Visit: 1 Visit OT Evaluation $OT Eval Moderate Complexity: 1 Mod  Lou Cal, OT [employer] Pager [phone] Office [phone]    Raymondo Band 12/25/2018, 4:35 PM

## 2018-12-25 NOTE — Progress Notes (Signed)
Physical Therapy Treatment Patient Details Name: Holly Cooper MRN: [id number] DOB: Mar 15, 1934 Today's Date: 12/25/2018    History of Present Illness Pt is a 85 y.o. 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 Also found to have RUE DVT.    PT Comments    Pt requiring increased assistance this session with all mobility. Transferring to chair via stand pivot with two person maximal assistance. Displays increased right lateral lean in sitting with fatigue. Given pt lack of steady progress and decreased activity tolerance, updating plan to discharge to SNF. Feel this level of therapy will be more appropriate given pt current deficits in order to maximize functional mobility.     Follow Up Recommendations  SNF     Equipment Recommendations  Other (comment)(TBD)    Recommendations for Other Services       Precautions / Restrictions Precautions Precautions: Fall Restrictions Weight Bearing Restrictions: No    Mobility  Bed Mobility Overal bed mobility: Needs Assistance Bed Mobility: Rolling;Sidelying to Sit Rolling: Max assist;+2 for safety/equipment Sidelying to sit: Max assist;+2 for physical assistance;+2 for safety/equipment       General bed mobility comments: assist for rolling to L/R with cues provided to reach with appropriate UE to self-assist, assist for LEs over EOB and to elevate trunk  Transfers Overall transfer level: Needs assistance Equipment used: 2 person hand held assist Transfers: Sit to/from Stand;Stand Pivot Transfers Sit to Stand: +2 physical assistance;+2 safety/equipment;Max assist Stand pivot transfers: Max assist;+2 physical assistance;+2 safety/equipment       General transfer comment: use of two person face to face method, max multimodal cues to have pt hold onto therapist's arms; requires facilitation/assist to achieve full upright standing  posture and to facilitate stepping LEs to turn/pivot towards recliner, pivoting towards the R side   Ambulation/Gait                 Stairs             Wheelchair Mobility    Modified Rankin (Stroke Patients Only)       Balance Overall balance assessment: Needs assistance Sitting-balance support: Feet supported Sitting balance-Leahy Scale: Fair Sitting balance - Comments: occasional right lateral lean, able to maintain with minguard assist briefly progressed to min/modA once fatigued     Standing balance-Leahy Scale: Poor Standing balance comment: reliant on external support                            Cognition Arousal/Alertness: Awake/alert Behavior During Therapy: WFL for tasks assessed/performed;Impulsive Overall Cognitive Status: Impaired/Different from baseline Area of Impairment: Orientation;Attention;Memory;Following commands;Safety/judgement;Awareness                 Orientation Level: Disoriented to;Place;Time;Situation Current Attention Level: Focused Memory: Decreased short-term memory Following Commands: Follows one step commands inconsistently;Follows one step commands with increased time Safety/Judgement: Decreased awareness of deficits Awareness: Intellectual   General Comments: Very pleasant, smiles in response to therapist's questions. Answers ~50% of questions appropriately with one word verbalizations; pt somewhat impulsive and requires cues for safety as she attempts to stand multiple times prior to having lines/etc ready      Exercises      General Comments General comments (skin integrity, edema, etc.): VSS      Pertinent Vitals/Pain Pain Assessment: Faces Faces Pain Scale: No hurt Pain Intervention(s): Monitored during session    Home  Living Family/patient expects to be discharged to:: Unsure               Additional Comments: Pt unable to provide home living set up    Prior Function Level of  Independence: Needs assistance  Gait / Transfers Assistance Needed: uses walker per chart review       PT Goals (current goals can now be found in the care plan section) Acute Rehab PT Goals Patient Stated Goal: none stated; agreeable to therapy Potential to Achieve Goals: Fair    Frequency    Min 2X/week      PT Plan Discharge plan needs to be updated    Co-evaluation PT/OT/SLP Co-Evaluation/Treatment: Yes Reason for Co-Treatment: Complexity of the patient's impairments (multi-system involvement);Necessary to address cognition/behavior during functional activity;For patient/therapist safety;To address functional/ADL transfers PT goals addressed during session: Mobility/safety with mobility OT goals addressed during session: ADL's and self-care;Strengthening/ROM      AM-PAC PT "6 Clicks" Mobility   Outcome Measure  Help needed turning from your back to your side while in a flat bed without using bedrails?: Total Help needed moving from lying on your back to sitting on the side of a flat bed without using bedrails?: Total Help needed moving to and from a bed to a chair (including a wheelchair)?: Total Help needed standing up from a chair using your arms (e.g., wheelchair or bedside chair)?: Total Help needed to walk in hospital room?: Total Help needed climbing 3-5 steps with a railing? : Total 6 Click Score: 6    End of Session Equipment Utilized During Treatment: Gait belt;Oxygen Activity Tolerance: Patient limited by fatigue Patient left: in chair;with call bell/phone within reach;with chair alarm set Nurse Communication: Mobility status PT Visit Diagnosis: Unsteadiness on feet (R26.81);Other abnormalities of gait and mobility (R26.89);Muscle weakness (generalized) (M62.81);Difficulty in walking, not elsewhere classified (R26.2)     Time: 5638-7564 PT Time Calculation (min) (ACUTE ONLY): 27 min  Charges:  $Therapeutic Activity: 8-22 mins                      Ellamae Sia, PT, DPT Acute Rehabilitation Services Pager [phone] Office [phone]    Willy Eddy 12/25/2018, 4:54 PM

## 2018-12-25 NOTE — Progress Notes (Signed)
NAME:  Holly Cooper, MRN:  [id number], DOB:  November 30, 1933, LOS: 16 ADMISSION DATE:  12/12/2018,  CHIEF COMPLAINT:  Seizures, Acute resp failure   Brief History   85 year old woman, history of CVA, history DVT/PE on Eliquis (question compliance). Experienced recurrent seizures that were treated initially with medazepam and then with Keppra.  She was intubated for failure to protect her airway.   Extubated 12/24/2018  Past Medical History  CVA,DVT/ PE,Hyperlipidemia,obesity  Significant Hospital Events   3/28 - loaded with fosphenytoin  3/29 following commands off sedation. Cont EEG continued. Unable to get LP. Started on empiric coverage for HSV encephalitis as well as bacterial coverage 3/30: Interventional radiology unable to do lumbar puncture due to inability lay patient in the prone position 3/31: Hemodynamically stable.  No seizures overnight.  Discontinuing Versed infusion 4/1: Ordering MRI 4/2: MRI back, no acute abnormalities.  Lower extremities Dopplers both negative for DVT.  More awake tolerating pressure support ventilation stopping treatment dose low molecular weight heparin continuing supportive care stopping antimicrobial therapy continuing acyclovir with presumptive diagnosis of HSV encephalitis 4/3: Now following commands.  Pressure support ventilation and spontaneous breathing trial attempted.  Assessing for extubation 4/4:Tolerating fairly well but with low tvs (250-350 cc). I dont believe that her current respiratory effort is adequate for extubation. 4/5 PS trials on 12 cm H2O with TV 300-375 rr 25.  Better but still not able to extubate.  Will repeat labs, CXR in the am. 4/6 on pressure support of 12/PEEP of 5 12/25/2018-extubated 12/24/2018  Consults:  Neurology  Procedures:  ETT 3/27 >>  PICC line left upper extremity 3/29  Significant Diagnostic Tests:  Head CT/ CTA 3/27 >> no evidence CVA, no evidence of large vessel occlusion Lower extremity ultrasound 4/1:  Negative for DVT bilaterally MRI brain 4/1: Motion degraded.  Chronic small vessel ischemic disease with chronic traumatic cerebellar infarcts no acute intracranial abnormalities 9 Micro Data:    Antimicrobials:  vanc 3/29>>> 4/2 CTX 3/29>>> 4/2 Amp 3/29>>> 4/2 Acyclovir 3/29>>>  Interim history/subjective:  Awake, weak , follows commands Appears to be comfortable  Objective   Blood pressure 137/67, pulse 78, temperature 98.2 F (36.8 C), temperature source Axillary, resp. rate (!) 24, height 5\' 6"  (1.676 m), weight 84.5 kg, SpO2 100 %.    FiO2 (%):  [2 %] 2 %   Intake/Output Summary (Last 24 hours) at 12/25/2018 0839 Last data filed at 12/25/2018 0800 Gross per 24 hour  Intake 982.57 ml  Output 1700 ml  Net -717.43 ml    Examination: Elderly lady, not in distress On oxygen supplementation Moist oral mucosa Chest equal movement, few rhonchi at the bases S1-S2 appreciated, systolic murmur at the base Abdomen is soft, nontender Extremities warm and dry She is interactive, following commands Still very weak  Resolved Hospital Problem list   Leukocytosis  History of treated DVT/PE completed 3 months of anticoagulation  Assessment & Plan:   Acute encephalopathy-working diagnosis of HSV encephalitis Status epilepticus No further seizure activity More awake and interactive On acyclovir day 11 of 14 Neurology to follow-up  Acute respiratory failure due to failure to protect airway, required intubation and mechanical ventilation Successfully extubated 12/24/2018 On oxygen supplementation and appears comfortable  Acute deep vein thrombosis On Lovenox  Anemia Trend CBC  Fluid and electrolyte imbalance, hypokalemia Potassium repleted  History of hypertension Continue to hold antihypertensives  Speech and physical therapy to follow  Diet n.p.o. recommended at present We will reinitiate tube feeds  Best practice:  Diet: TF start 3/28, reinitiated 12/25/2018  Pain/Anxiety/Delirium protocol (if indicated): Intermittent Versed, fentanyl VAP protocol (if indicated): Ordered 3/27 DVT prophylaxis: SCD GI prophylaxis: Protonix  Glucose control: NA Mobility: Bedrest Code Status: Full code Family Communication: No family present currently, updates per nursing Disposition: ICU Continues treatment for HSV encephalitis  Critical care time spent evaluating patient, reviewing records, formulating plan of care 30 minutes  Sherrilyn Rist, MD Cell: [phone] 12/25/2018 8:39 AM

## 2018-12-25 NOTE — Progress Notes (Signed)
  Speech Language Pathology Treatment: Dysphagia  Patient Details Name: Holly Cooper MRN: [id number] DOB: 19-May-1934 Today's Date: 12/25/2018 Time: 6073-7106 SLP Time Calculation (min) (ACUTE ONLY): 11 min  Assessment / Plan / Recommendation Clinical Impression  Pt continues to present with severe dysphagia in setting of recent prolonged intubation.  Thick clear secretions noted in oral cavity.  SLP provided oral care prior to administration of PO trials.  Pt confused on arrival and wanting to get out of bed.  Pt slightly resistant to oral care.  Pt's voice clear at baseline, but with reduced vocal intensity.  With ice chips, there were multiple swallows, wet vocal quality, and immediate and delayed coughing.  Pt would not accept small amount of water via teaspoon.  Recommend pt remain NPO with alternate means of nutrition, hydration, and medication.   HPI HPI: 85 year old woman, history of CVA, history DVT/PE on Eliquis (question compliance). Experienced recurrent seizures that were treated initially with medazepam and then with Keppra. She was intubated for failure to protect her airway 3/28-4/8. MRI negative for acute infarct, per MD: Acute encephalopathy-working diagnosis of HSV encephalitis      SLP Plan  Continue with current plan of care       Recommendations  Diet recommendations: NPO Medication Administration: Via alternative means                Oral Care Recommendations: Oral care QID SLP Visit Diagnosis: Dysphagia, oropharyngeal phase (R13.12) Plan: Continue with current plan of care       [place], [place], [place] Office: [phone], Pager (4/9): [phone] 12/25/2018, 12:42 PM

## 2018-12-25 NOTE — Progress Notes (Signed)
Called about patient's blood pressure   10 mg labetalol for blood pressure greater than 160 every 2  5 mg Norvasc daily via tube

## 2018-12-25 NOTE — Progress Notes (Signed)
Nutrition Follow-up RD working remotely.  DOCUMENTATION CODES:   Obesity unspecified  INTERVENTION:   Continue:  Jevity 1.2 @ 41 ml/hr 60 ml Prostat BID  Provides: 1580 kcal (98% of needs), 115 grams protein (100% of needs), and 798 ml free water.   NUTRITION DIAGNOSIS:   Inadequate oral intake related to inability to eat as evidenced by NPO status. Ongoing.   GOAL:   Patient will meet greater than or equal to 90% of their needs Met.   MONITOR:   TF tolerance  REASON FOR ASSESSMENT:   Ventilator, Consult Enteral/tube feeding initiation and management  ASSESSMENT:   85 year old woman, history of CVA, history DVT/PE on Eliquis (question compliance). Experienced recurrent seizures that were treated initially with medazepam and then with Keppra.  She was intubated for failure to protect her airway.  Transfer is now to [facility] for further evaluation mechanically ventilated.   Pt admitted with acute encephalopathy due to HSV  4/8 extubated, failed swallow eval, NG tube remains in place for feeding   NG tube - L nare  Medications reviewed and include: 20 mEq KCl TID Labs reviewed  NUTRITION - FOCUSED PHYSICAL EXAM:  Deferred   Diet Order:   Diet Order            Diet NPO time specified  Diet effective now              EDUCATION NEEDS:   Not appropriate for education at this time  Skin:  Skin Assessment: Reviewed RN Assessment  Last BM:  4/8  Height:   Ht Readings from Last 1 Encounters:  12/13/18 5' 6"  (1.676 m)    Weight:   Wt Readings from Last 1 Encounters:  12/25/18 84.5 kg    Ideal Body Weight:  59.1 kg  BMI:  Body mass index is 30.07 kg/m.  Estimated Nutritional Needs:   Kcal:  1600-1700  Protein:  85-100 grams  Fluid:  >1.6 L/day  Maylon Peppers RD, LDN, CNSC [phone] Pager [phone] After Hours Pager

## 2018-12-26 ENCOUNTER — Inpatient Hospital Stay (HOSPITAL_COMMUNITY): Payer: Medicare Other

## 2018-12-26 LAB — MAGNESIUM
Magnesium: 2 mg/dL (ref 1.7–2.4)
Magnesium: 2.1 mg/dL (ref 1.7–2.4)

## 2018-12-26 LAB — GLUCOSE, CAPILLARY
Glucose-Capillary: 84 mg/dL (ref 70–99)
Glucose-Capillary: 84 mg/dL (ref 70–99)
Glucose-Capillary: 89 mg/dL (ref 70–99)
Glucose-Capillary: 91 mg/dL (ref 70–99)
Glucose-Capillary: 92 mg/dL (ref 70–99)
Glucose-Capillary: 97 mg/dL (ref 70–99)

## 2018-12-26 LAB — PHOSPHORUS
Phosphorus: 3.3 mg/dL (ref 2.5–4.6)
Phosphorus: 3.3 mg/dL (ref 2.5–4.6)

## 2018-12-26 MED ORDER — SODIUM CHLORIDE 0.9 % IV SOLN
200.0000 mg | Freq: Two times a day (BID) | INTRAVENOUS | Status: DC
  Administered 2018-12-26 – 2018-12-29 (×6): 200 mg via INTRAVENOUS
  Filled 2018-12-26 (×11): qty 20

## 2018-12-26 MED ORDER — PHENYTOIN 125 MG/5ML PO SUSP
100.0000 mg | Freq: Three times a day (TID) | ORAL | Status: DC
  Administered 2018-12-26: 100 mg
  Filled 2018-12-26 (×2): qty 4

## 2018-12-26 MED ORDER — ENOXAPARIN SODIUM 80 MG/0.8ML ~~LOC~~ SOLN
80.0000 mg | Freq: Two times a day (BID) | SUBCUTANEOUS | Status: DC
  Administered 2018-12-26 – 2018-12-29 (×6): 80 mg via SUBCUTANEOUS
  Filled 2018-12-26 (×5): qty 0.8

## 2018-12-26 MED ORDER — POTASSIUM CHLORIDE 20 MEQ/15ML (10%) PO SOLN
20.0000 meq | Freq: Every day | ORAL | Status: DC
  Administered 2018-12-27 – 2018-12-29 (×3): 20 meq
  Filled 2018-12-26 (×3): qty 15

## 2018-12-26 MED ORDER — LEVETIRACETAM 100 MG/ML PO SOLN
1000.0000 mg | Freq: Two times a day (BID) | ORAL | Status: DC
  Filled 2018-12-26: qty 10

## 2018-12-26 MED ORDER — LACOSAMIDE 10 MG/ML PO SOLN: 200.0000 mg | Freq: Two times a day (BID) | ORAL | Status: DC

## 2018-12-26 NOTE — Progress Notes (Signed)
ANTICOAGULATION CONSULT NOTE - Follow Up Consult  Pharmacy Consult for Lovenox Indication: acute DVT   Allergies  Allergen Reactions  . Atorvastatin Other (See Comments)    Muscle aches - back     Patient Measurements: Height: 5\' 6"  (167.6 cm)(from 11/14/18 encounter) Weight: 181 lb 7 oz (82.3 kg) IBW/kg (Calculated) : 59.3 Lovenox Dosing Weight: 82 kg  Vital Signs: Temp: 99.3 F (37.4 C) (04/10 0400) Temp Source: Axillary (04/10 0400) BP: 101/57 (04/10 0700) Pulse Rate: 89 (04/10 0700)  Labs: Recent Labs    12/23/18 0923 12/24/18 0422  HGB  --  9.4*  HCT  --  30.9*  PLT  --  317  CREATININE 0.54 0.57    Estimated Creatinine Clearance: 57.6 mL/min (by C-G formula based on SCr of 0.57 mg/dL).  Assessment:  59 YOF presented on 12/12/18 with seizure and acute respiratory failure.  Patient has a history of DVT/PE on Coumadin PTA, but compliance was quesitonable, as INR was subtherapeutic(1.4) on admit.  Doppler 12/20/18 showed acute DVT involving the right axillary/brachial veins and acute superficial vein thrombosis of right basilic/cephalic veins. Pharmacy consulted to dose Lovenox for VTE treatment.       Weight 87 > 82 kg since admitted.  Renal function and CBC stable.    Tube feedings initiated 4/9.  Currently on Lovenox 85 mg sq q12hrs.  Goal of Therapy:  Anti-Xa level 0.6-1 units/ml 4hrs after LMWH dose given Monitor platelets by anticoagulation protocol: Yes   Plan:   Adjust Lovenox to 80 mg sq q12hrs for decreased weight. (80 mg = syringe size)  CBC q72hs while on Lovenox.  Follow up for oral/per tube anticoagulation.  Arty Baumgartner, [place] Pager: [phone] or phone: [phone] 12/26/2018,8:50 AM

## 2018-12-26 NOTE — Progress Notes (Signed)
Physical Therapy Treatment Patient Details Name: Holly Cooper MRN: [id number] DOB: 1934/03/14 Today's Date: 12/26/2018    History of Present Illness Pt is a 85 y.o. 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 Also found to have RUE DVT.    PT Comments    Pt with increased confusion this session, with wet, garbled, incomprehensible speech. Not smiling and interactive like yesterday session, 4/9. Did complain of LLE pain upon sitting edge of bed. Requiring two person total assistance for all aspects of bed mobility. Session focused on static and dynamic sitting balance edge of bed in addition to low level exercises. Pt remains very weak and debilitated. Continue to recommend SNF for ongoing Physical Therapy.       Follow Up Recommendations  SNF     Equipment Recommendations  Other (comment)(TBD)    Recommendations for Other Services       Precautions / Restrictions Precautions Precautions: Fall Restrictions Weight Bearing Restrictions: No    Mobility  Bed Mobility Overal bed mobility: Needs Assistance Bed Mobility: Supine to Sit;Sit to Supine Rolling: +2 for safety/equipment;Total assist Sidelying to sit: +2 for physical assistance;Total assist       General bed mobility comments: TotalA + 2 for all aspects of bed mobility. Pt with no initiation of movement  Transfers                    Ambulation/Gait                 Stairs             Wheelchair Mobility    Modified Rankin (Stroke Patients Only)       Balance Overall balance assessment: Needs assistance Sitting-balance support: Feet supported Sitting balance-Leahy Scale: Poor Sitting balance - Comments: reliant on bilateral external support. heavy right lateral lean initially but able to progress to min guard assist                                     Cognition  Arousal/Alertness: Awake/alert Behavior During Therapy: [facility] for tasks assessed/performed;Impulsive Overall Cognitive Status: Impaired/Different from baseline Area of Impairment: Orientation;Attention;Memory;Following commands;Safety/judgement;Awareness                 Orientation Level: Disoriented to;Place;Time;Situation Current Attention Level: Focused Memory: Decreased short-term memory Following Commands: Follows one step commands inconsistently;Follows one step commands with increased time Safety/Judgement: Decreased awareness of deficits Awareness: Intellectual   General Comments: Increased confusion, with incomprehensible speech. Following simple commands < 25% of the time       Exercises General Exercises - Lower Extremity Long Arc Quad: 5 reps;Both;Seated    General Comments        Pertinent Vitals/Pain Pain Assessment: Faces Faces Pain Scale: Hurts even more Pain Location: LLE Pain Descriptors / Indicators: Grimacing Pain Intervention(s): Limited activity within patient's tolerance;Monitored during session    Home Living                      Prior Function            PT Goals (current goals can now be found in the care plan section) Acute Rehab PT Goals Patient Stated Goal: none stated; agreeable to therapy Potential to Achieve Goals: Fair Progress towards PT goals: Not progressing toward goals - comment(increased confusion today)  Frequency    Min 2X/week      PT Plan Current plan remains appropriate    Co-evaluation              AM-PAC PT "6 Clicks" Mobility   Outcome Measure  Help needed turning from your back to your side while in a flat bed without using bedrails?: Total Help needed moving from lying on your back to sitting on the side of a flat bed without using bedrails?: Total Help needed moving to and from a bed to a chair (including a wheelchair)?: Total Help needed standing up from a chair using your arms (e.g.,  wheelchair or bedside chair)?: Total Help needed to walk in hospital room?: Total Help needed climbing 3-5 steps with a railing? : Total 6 Click Score: 6    End of Session Equipment Utilized During Treatment: Gait belt;Oxygen Activity Tolerance: Patient limited by fatigue Patient left: with call bell/phone within reach;in bed Nurse Communication: Mobility status PT Visit Diagnosis: Unsteadiness on feet (R26.81);Other abnormalities of gait and mobility (R26.89);Muscle weakness (generalized) (M62.81);Difficulty in walking, not elsewhere classified (R26.2)     Time: 7048-8891 PT Time Calculation (min) (ACUTE ONLY): 22 min  Charges:  $Therapeutic Activity: 8-22 mins                     Ellamae Sia, PT, DPT Acute Rehabilitation Services Pager [phone] Office [phone]    Willy Eddy 12/26/2018, 4:21 PM

## 2018-12-26 NOTE — Progress Notes (Signed)
NAME:  Holly Cooper, MRN:  [id number], DOB:  1933-12-14, LOS: 75 ADMISSION DATE:  12/12/2018,  CHIEF COMPLAINT:  Seizures, Acute resp failure   Brief History   85 year old woman, history of CVA, history DVT/PE on Eliquis (question compliance). Experienced recurrent seizures that were treated initially with medazepam and then with Keppra.  She was intubated for failure to protect her airway.   Extubated 12/24/2018  Past Medical History  CVA,DVT/ PE,Hyperlipidemia,obesity  Significant Hospital Events   3/28 - loaded with fosphenytoin  3/29 following commands off sedation. Cont EEG continued. Unable to get LP. Started on empiric coverage for HSV encephalitis as well as bacterial coverage 3/30: Interventional radiology unable to do lumbar puncture due to inability lay patient in the prone position 3/31: Hemodynamically stable.  No seizures overnight.  Discontinuing Versed infusion 4/1: Ordering MRI 4/2: MRI back, no acute abnormalities.  Lower extremities Dopplers both negative for DVT.  More awake tolerating pressure support ventilation stopping treatment dose low molecular weight heparin continuing supportive care stopping antimicrobial therapy continuing acyclovir with presumptive diagnosis of HSV encephalitis 4/3: Now following commands.  Pressure support ventilation and spontaneous breathing trial attempted.  Assessing for extubation 4/4:Tolerating fairly well but with low tvs (250-350 cc). I dont believe that her current respiratory effort is adequate for extubation. 4/5 PS trials on 12 cm H2O with TV 300-375 rr 25.  Better but still not able to extubate.  Will repeat labs, CXR in the am. 4/6 on pressure support of 12/PEEP of 5 12/25/2018-extubated 12/24/2018  Consults:  Neurology  Procedures:  ETT 3/27 >> 4/8 PICC line left upper extremity 3/29  Significant Diagnostic Tests:  Head CT/ CTA 3/27 >> no evidence CVA, no evidence of large vessel occlusion Lower extremity ultrasound 4/1:  Negative for DVT bilaterally MRI brain 4/1: Motion degraded.  Chronic small vessel ischemic disease with chronic traumatic cerebellar infarcts no acute intracranial abnormalities 9 Micro Data:    Antimicrobials:  vanc 3/29>>> 4/2 CTX 3/29>>> 4/2 Amp 3/29>>> 4/2 Acyclovir 3/29>>>  Interim history/subjective:  Remains extubated, no evidence for poor airway protection or respiratory distress Continues to have encephalopathy, intermittently interacts with nursing, globally weak  Objective   Blood pressure 108/62, pulse 76, temperature 99.6 F (37.6 C), temperature source Axillary, resp. rate (!) 22, height 5\' 6"  (1.676 m), weight 82.3 kg, SpO2 98 %.        Intake/Output Summary (Last 24 hours) at 12/26/2018 1142 Last data filed at 12/26/2018 1005 Gross per 24 hour  Intake 1753.15 ml  Output 2200 ml  Net -446.85 ml    Examination: GEN: Ill-appearing elderly woman, in no distress, somnolent HEENT: Edentulous, no evidence for airway secretions or poor airway management. Oropharynx moist without lesions Lungs clear bilaterally without wheezes Heart regular with a 2/6 systolic murmur Abdomen is soft, nondistended with positive bowel sounds Extremities without significant edema She is difficult to wake up, but will interact and attempts to answer questions although some of her responses are unintelligible.  She moves all extremities but is globally weak  Resolved Hospital Problem list   Leukocytosis  History of treated DVT/PE completed 3 months of anticoagulation  Assessment & Plan:   Acute encephalopathy-working diagnosis of HSV encephalitis Status epilepticus, resolved Appreciate neurology input Plan to complete 14 days acyclovir, currently day 12 of 14 Remains on Vimpat, Keppra, Dilantin Continues to have some hypersomnolence superimposed on her baseline dementia. Attempt to minimize any sedating medications.  Consider ABG if concern for hypercapnia going forward  Acute  respiratory failure due to failure to protect airway, required intubation and mechanical ventilation Adequate airway protection since extubation 4/8 Remains high risk for aspiration, SLP following as below  Acute deep vein thrombosis Enoxaparin as ordered.  Will need to determine timing to change to enteral regimen, she will need improvement in swallowing function  Anemia Follow CBC  Fluid and electrolyte imbalance, hypokalemia Repleting electrolytes as indicated Follow BMP 4/11  History of hypertension Amlodipine added per tube 4/9  Dysphasia Speech and physical therapy following Recommend n.p.o. at present, continue tube feeds until her swallowing improves  Best practice:  Diet: TF start 3/28, reinitiated 12/25/2018 Pain/Anxiety/Delirium protocol (if indicated): Stopped VAP protocol (if indicated): Extubated DVT prophylaxis: SCD, therapeutic enoxaparin GI prophylaxis: Protonix  Glucose control: NA Mobility: OOB with assist Code Status: Full code Family Communication: Phone communication with son Disposition: Plan to transition out of the intensive care unit 4/10   Baltazar Apo, MD, PhD 12/26/2018, 11:51 AM [facility] [phone] or if no answer [phone]

## 2018-12-27 ENCOUNTER — Inpatient Hospital Stay (HOSPITAL_COMMUNITY): Payer: Medicare Other

## 2018-12-27 DIAGNOSIS — Z7189 Other specified counseling: Secondary | ICD-10-CM

## 2018-12-27 DIAGNOSIS — Z515 Encounter for palliative care: Secondary | ICD-10-CM

## 2018-12-27 LAB — COMPREHENSIVE METABOLIC PANEL
ALT: 62 U/L — ABNORMAL HIGH (ref 0–44)
AST: 59 U/L — ABNORMAL HIGH (ref 15–41)
Albumin: 1.8 g/dL — ABNORMAL LOW (ref 3.5–5.0)
Alkaline Phosphatase: 84 U/L (ref 38–126)
Anion gap: 11 (ref 5–15)
BUN: 20 mg/dL (ref 8–23)
CO2: 25 mmol/L (ref 22–32)
Calcium: 9.1 mg/dL (ref 8.9–10.3)
Chloride: 101 mmol/L (ref 98–111)
Creatinine, Ser: 0.61 mg/dL (ref 0.44–1.00)
GFR calc Af Amer: >60 mL/min (ref >60)
GFR calc non Af Amer: >60 mL/min (ref >60)
Glucose, Bld: 83 mg/dL (ref 70–99)
Potassium: 3.8 mmol/L (ref 3.5–5.1)
Sodium: 137 mmol/L (ref 135–145)
Total Bilirubin: 0.4 mg/dL (ref 0.3–1.2)
Total Protein: 5.5 g/dL — ABNORMAL LOW (ref 6.5–8.1)

## 2018-12-27 LAB — URINALYSIS, ROUTINE W REFLEX MICROSCOPIC
Bilirubin Urine: NEGATIVE
Glucose, UA: NEGATIVE mg/dL
Ketones, ur: NEGATIVE mg/dL
Nitrite: NEGATIVE
Protein, ur: NEGATIVE mg/dL
Specific Gravity, Urine: 1.01 (ref 1.005–1.030)
pH: 7.5 (ref 5.0–8.0)

## 2018-12-27 LAB — CBC
HCT: 30.4 % — ABNORMAL LOW (ref 36.0–46.0)
Hemoglobin: 9.4 g/dL — ABNORMAL LOW (ref 12.0–15.0)
MCH: 28.9 pg (ref 26.0–34.0)
MCHC: 30.9 g/dL (ref 30.0–36.0)
MCV: 93.5 fL (ref 80.0–100.0)
Platelets: 382 10*3/uL (ref 150–400)
RBC: 3.25 MIL/uL — ABNORMAL LOW (ref 3.87–5.11)
RDW: 15.3 % (ref 11.5–15.5)
WBC: 7.3 10*3/uL (ref 4.0–10.5)
nRBC: 0 % (ref 0.0–0.2)

## 2018-12-27 LAB — GLUCOSE, CAPILLARY
Glucose-Capillary: 79 mg/dL (ref 70–99)
Glucose-Capillary: 73 mg/dL (ref 70–99)
Glucose-Capillary: 85 mg/dL (ref 70–99)
Glucose-Capillary: 98 mg/dL (ref 70–99)
Glucose-Capillary: 111 mg/dL — ABNORMAL HIGH (ref 70–99)

## 2018-12-27 LAB — URINALYSIS, MICROSCOPIC (REFLEX)

## 2018-12-27 MED ORDER — PHENYTOIN SODIUM 50 MG/ML IJ SOLN
100.0000 mg | Freq: Three times a day (TID) | INTRAMUSCULAR | Status: DC
  Administered 2018-12-27 – 2018-12-29 (×7): 100 mg via INTRAVENOUS
  Filled 2018-12-27 (×7): qty 2

## 2018-12-27 MED ORDER — LEVETIRACETAM IN NACL 1000 MG/100ML IV SOLN
1000.0000 mg | Freq: Two times a day (BID) | INTRAVENOUS | Status: DC
  Administered 2018-12-27 – 2018-12-29 (×5): 1000 mg via INTRAVENOUS
  Filled 2018-12-27 (×5): qty 100

## 2018-12-27 NOTE — Consult Note (Signed)
Consultation Note Date: 12/27/2018   Patient Name: Holly Cooper  DOB: 1934-04-10  MRN: [id number]  Age / Sex: 85 y.o., female  PCP: Patient, No Pcp Per Referring Physician: Shelly Coss, MD  Reason for Consultation: Establishing goals of care and Psychosocial/spiritual support  HPI/Patient Profile: 85 y.o. female  with past medical history of CVA, DVT, pulmonary emboli, hyperlipidemia, admitted on 12/12/2018 with new onset of seizures.  Patient was found unresponsive, down at home.  She required intubation to protect her airway.  Her working diagnosis is HSV encephalitis.  She has remained very encephalopathic, and has been unable to swallow.  She is currently undergoing temporary tube feedings via core track.  She also has been accepted to inpatient rehab  Consult ordered for goals of care.   Clinical Assessment and Goals of Care: Patient seen, chart reviewed.  Patient opens her eyes to voice but is nonverbal.  She is attempting to speak to me but no words are formed.  Per chart review she has severe dysphasia since being admitted in status epilepticus.  She has been fed through core track presently.  She is observed to be drooling.  Spoke to one of patient's daughters, Delbert Phenix.  Patient has 6 living children per [place].  There is no designated healthcare power of attorney.  She and her 5 siblings are making decisions as a family.  She shares with me that her mother has never wanted to talk about advanced directives.Pamala Hurry states that f her mother "would not want to live on machines".  Patient had been intubated and extubated during this hospitalization and family is undecided whether she would want to pursue reintubation. Pamala Hurry  states "that is something I will need to talk to my siblings about".    I also introduced the topic of artificial feeding, risks/benefits.  Also discussed and defined full  code versus DNR  Patient is unable to participate in goals of care herself at this point.  Her decision makers would be the majority of her 6 children     SUMMARY OF RECOMMENDATIONS   Continue full scope of treatment for now Continue core track.  Family is hopeful that with time she can regain her ability to swallow and not have to pursue a PEG tube.  She does state though this is something that she and her family will have to discuss as well as CODE STATUS and other goals of care Given COVID-19, families are unable to visit or have face-to-face goals of care meetings in the hospital.  I did email Pamala Hurry Hard Choices for [employer] booklet as well as a MOST form to help guide subsequent conversations among the family Palliative medicine to continue to reach out to family and assist in goals of care and will attempt to arrange family mtg via phone Code Status/Advance Care Planning:  Full code    Palliative Prophylaxis:   Aspiration, Bowel Regimen, Delirium Protocol, Eye Care, Frequent Pain Assessment, Oral Care and Turn Reposition  Additional Recommendations (Limitations, Scope, Preferences):  Full Scope Treatment  Psycho-social/Spiritual:   Desire for further Chaplaincy support:no  Additional Recommendations: Referral to Community Resources   Prognosis:   Unable to determine  Discharge Planning: CIR     Primary Diagnoses: Present on Admission: **None**   I have reviewed the medical record, interviewed the patient and family, and examined the patient. The following aspects are pertinent.  No past medical history on file. Social History   Socioeconomic History  . Marital status: Widowed    Spouse name: Not on file  . Number of children: Not on file  . Years of education: Not on file  . Highest education level: Not on file  Occupational History  . Not on file  Social Needs  . Financial resource strain: Not on file  . Food insecurity:    Worry: Not on file     Inability: Not on file  . Transportation needs:    Medical: Not on file    Non-medical: Not on file  Tobacco Use  . Smoking status: Not on file  Substance and Sexual Activity  . Alcohol use: Not on file  . Drug use: Not on file  . Sexual activity: Not on file  Lifestyle  . Physical activity:    Days per week: Not on file    Minutes per session: Not on file  . Stress: Not on file  Relationships  . Social connections:    Talks on phone: Not on file    Gets together: Not on file    Attends religious service: Not on file    Active member of club or organization: Not on file    Attends meetings of clubs or organizations: Not on file    Relationship status: Not on file  Other Topics Concern  . Not on file  Social History Narrative  . Not on file   No family history on file. Scheduled Meds: . amLODipine  5 mg Per Tube Daily  . chlorhexidine  15 mL Mouth Rinse BID  . Chlorhexidine Gluconate Cloth  6 each Topical Daily  . enoxaparin (LOVENOX) injection  80 mg Subcutaneous Q12H  . feeding supplement (PRO-STAT SUGAR FREE 64)  60 mL Per Tube BID  . mouth rinse  15 mL Mouth Rinse q12n4p  . pantoprazole sodium  40 mg Per Tube Daily  . phenytoin (DILANTIN) IV  100 mg Intravenous Q8H  . potassium chloride  20 mEq Per Tube Daily   Continuous Infusions: . sodium chloride 10 mL/hr at 12/26/18 1200  . acyclovir 700 mg (12/27/18 1307)  . feeding supplement (JEVITY 1.2 CAL) 1,000 mL (12/26/18 0905)  . lacosamide (VIMPAT) IV 200 mg (12/27/18 1207)  . lactated ringers 10 mL/hr at 12/18/18 1900  . levETIRAcetam 1,000 mg (12/27/18 0404)   PRN Meds:.sodium chloride, acetaminophen (TYLENOL) oral liquid 160 mg/5 mL, albuterol, bisacodyl, docusate, labetalol, sodium chloride flush Medications Prior to Admission:  Prior to Admission medications   Medication Sig Start Date End Date Taking? Authorizing Provider  lisinopril-hydrochlorothiazide (PRINZIDE,ZESTORETIC) 20-25 MG tablet Take 1 tablet  by mouth daily.   Yes [provider]  ondansetron (ZOFRAN) 8 MG tablet Take 8 mg by mouth every 8 (eight) hours as needed for nausea or vomiting.   Yes [provider]  warfarin (COUMADIN) 1 MG tablet Take 0.5 mg by mouth daily. Take 1/2 tablet (0.5 mg) by mouth with a 2 mg tablets for a total daily dose of 2.5 mg   Yes [provider]  warfarin (COUMADIN) 2 MG  tablet Take 2 mg by mouth daily. Take 1 tablet (2 mg) with 1/2 1 mg tablet (0.5 mg) for a total daily dose of 2.5 mg   Yes [provider]   Allergies  Allergen Reactions  . Atorvastatin Other (See Comments)    Muscle aches - back    Review of Systems  Unable to perform ROS: Patient nonverbal    Physical Exam Vitals signs and nursing note reviewed.  Constitutional:      Comments: Ill-appearing elderly female.  Will open her eyes to voice, nonverbal and quickly drifts off to sleep  HENT:     Head: Normocephalic and atraumatic.     Comments: Drooling Cardiovascular:     Rate and Rhythm: Normal rate.     Pulses: Normal pulses.  Pulmonary:     Effort: Pulmonary effort is normal.  Neurological:     Comments: Unable to assess orientation; patient is nonverbal, lethargic  Psychiatric:     Comments: Patient remains lethargic; no overt agitation otherwise unable to perform mental status exam     Vital Signs: BP 101/79 (BP Location: Left Leg)   Pulse 83   Temp 98.6 F (37 C) (Oral)   Resp 18   Ht 5\' 6"  (1.676 m) Comment: from 11/14/18 encounter  Wt 86.6 kg   SpO2 100%   BMI 30.81 kg/m  Pain Scale: 0-10   Pain Score: 0-No pain   SpO2: SpO2: 100 % O2 Device:SpO2: 100 % O2 Flow Rate: .O2 Flow Rate (L/min): (S) 1 L/min  IO: Intake/output summary:   Intake/Output Summary (Last 24 hours) at 12/27/2018 1642 Last data filed at 12/27/2018 1500 Gross per 24 hour  Intake 175 ml  Output -  Net 175 ml    LBM: Last BM Date: 12/27/18 Baseline Weight: Weight: 87.5 kg(from 11/14/18  encounter) Most recent weight: Weight: 86.6 kg     Palliative Assessment/Data:   Flowsheet Rows     Most Recent Value  Intake Tab  Referral Department  Hospitalist  Unit at Time of Referral  Med/Surg Unit  Palliative Care Primary Diagnosis  Neurology  Date Notified  12/27/18  Palliative Care Type  New Palliative care  Reason for referral  Clarify Goals of Care, Psychosocial or Spiritual support  Date of Admission  12/12/18  Date first seen by Palliative Care  12/27/18  # of days Palliative referral response time  0 Day(s)  # of days IP prior to Palliative referral  15  Clinical Assessment  Palliative Performance Scale Score  30%  Pain Max last 24 hours  Not able to report  Pain Min Last 24 hours  Not able to report  Dyspnea Max Last 24 Hours  Not able to report  Dyspnea Min Last 24 hours  Not able to report  Nausea Max Last 24 Hours  Not able to report  Nausea Min Last 24 Hours  Not able to report  Anxiety Max Last 24 Hours  Not able to report  Anxiety Min Last 24 Hours  Not able to report  Other Max Last 24 Hours  Not able to report  Psychosocial & Spiritual Assessment  Palliative Care Outcomes  Patient/Family meeting held?  Yes  Who was at the meeting?  pt, dtr  Palliative Care follow-up planned  Yes, Facility      Time In: 1500 Time Out: 1600 Time Total: 60 min Greater than 50%  of this time was spent counseling and coordinating care related to the above assessment and plan.  Signed by: Dory Horn, NP   Please contact Palliative Medicine Team phone at [phone] for questions and concerns.  For individual provider: See Shea Evans

## 2018-12-27 NOTE — Progress Notes (Addendum)
PROGRESS NOTE    Holly Cooper  FBP:[id number] DOB: 03/31/1934 DOA: 12/12/2018 PCP: Patient, No Pcp Per   Brief Narrative: Patient is 85 year old female with past medical history of CVA, history of DVT/PE on Eliquis with questionable compliance who presented to the emergency department with recurrent seizures.  She was admitted with status epilepticus.  She was intubated for failure to protect her airway.  She was initially admitted under PCCM service.  She has been extubated and handed over to hospitalist team on 12/27/2018.  Currently she remains bedbound and unresponsive most of the time.  Since he has been unable to tolerate anything by mouth, she has been started on tube feeding.  Physical therapy/Occupational Therapy have recommended her CIR on discharge.  Assessment & Plan:   Active Problems:   Seizures ([place])   Acute respiratory failure with hypoxia ([facility])   Encephalitis  Acute encephalopathy: Continues to remain encephalopathic.  Hardly responsive this morning.  Responsive to only painful stimuli.  Continue to monitor mental status.  Patient was treated empirically for HSV encephalitis.  Currently on acyclovir.  Day 13/14.  Plan is to complete 14 days of treatment.  Neurology following earlier.  LP was not possible after attempted but IR. Minimize sedating medications.  Status epilepticus: Presented with recurrent seizures.  Neurology was following.  Currently on IV Vimpat, Keppra and Dilantin.  No history of seizures in the past.  Neurology thought that her seizures are most likely associated with  her old stroke.  Neurology recommended to continue these antiseizure medications indefinitely and follow-up as an outpatient.  Acute respiratory failure: Required intubation to protect airway.  Finally extubated.  SLP following.  History of stroke: She has history of old strokes.  MRI of the brain showed atrophy and chronic infarctions.  Neurology was following.  Inability to take  orally/dysphagia: Speech therapy following.  Continue to recommend n.p.o.  Start on tube feeding.  If her mental status doesnot  improve, she might be a candidate for PEG placement.We  will discuss with family.  History of DVT/PE/new acute DVT: Was on Eliquis at home but not sure whether she was compliant.  She has history of DVT and PE.  Doppler study done here showed acute DVT in the right axillary vein and right brachial vein.  She has been started on Lovenox here.  Will transition to Eliquis when appropriate.  Hypertension: Currently blood pressure stable. On amlodipine.  Normocytic anemia: Continue to monitor H&H.  Debility/deconditioning: Physical therapy and occupational therapy evaluated the patient recommended CIR on discharge.  I have requested for CIR evaluation  Continued unresponsiveness/poor oral intake/poor prognosis/multiple comorbidities: We need to discuss goals of care for this patient.  She is elderly and has multiple comorbidities.  Cannot take anything by mouth currently.  Will discuss with family.  She can be a candidate for hospice.  Requested for palliative  care evaluation.  Nutrition Problem: Inadequate oral intake Etiology: inability to eat      DVT prophylaxis:Lovenox Code Status: Full Family Communication: [employer] not received.Discussed briefly with another daughter Pamala Hurry Disposition Plan: Undetermined at this point.  Most likely CIR   Consultants: PCCM, neurology  Procedures: Intubation/extubation  Antimicrobials:  Anti-infectives (From admission, onward)   Start     Dose/Rate Route Frequency Ordered Stop   12/18/18 1800  vancomycin (VANCOCIN) IVPB 1000 mg/200 mL premix  Status:  Discontinued     1,000 mg 200 mL/hr over 60 Minutes Intravenous Every 12 hours 12/18/18 0912 12/18/18 0936  12/15/18 0600  vancomycin (VANCOCIN) IVPB 750 mg/150 ml premix  Status:  Discontinued     750 mg 150 mL/hr over 60 Minutes Intravenous Every 12 hours  12/14/18 1711 12/18/18 0912   12/14/18 1800  acyclovir (ZOVIRAX) 700 mg in dextrose 5 % 100 mL IVPB     700 mg 114 mL/hr over 60 Minutes Intravenous Every 8 hours 12/14/18 1711 12/28/18 2159   12/14/18 1800  ampicillin (OMNIPEN) 2 g in sodium chloride 0.9 % 100 mL IVPB  Status:  Discontinued     2 g 300 mL/hr over 20 Minutes Intravenous Every 4 hours 12/14/18 1711 12/18/18 0936   12/14/18 1800  cefTRIAXone (ROCEPHIN) 2 g in sodium chloride 0.9 % 100 mL IVPB  Status:  Discontinued     2 g 200 mL/hr over 30 Minutes Intravenous Every 12 hours 12/14/18 1711 12/18/18 0936   12/14/18 1800  vancomycin (VANCOCIN) 1,500 mg in sodium chloride 0.9 % 500 mL IVPB     1,500 mg 250 mL/hr over 120 Minutes Intravenous  Once 12/14/18 1711 12/14/18 2038      Subjective: Patient seen and examined the bedside this morning.  Hardly responsive.  Does not follow any commands.  Moves her extremities .  Hemodynamically stable.  Pulled out his feeding tube last night.  New feeding tube placed today morning.  Objective: Vitals:   12/26/18 2333 12/27/18 0351 12/27/18 0500 12/27/18 0811  BP: (!) 138/104 (!) 112/50  108/64  Pulse: 91 85  82  Resp: (!) 25 20  17   Temp: 99.8 F (37.7 C) 98.7 F (37.1 C)  98.6 F (37 C)  TempSrc: Oral Axillary  Axillary  SpO2: 99% 98%  100%  Weight:   86.6 kg   Height:        Intake/Output Summary (Last 24 hours) at 12/27/2018 1144 Last data filed at 12/26/2018 1337 Gross per 24 hour  Intake 10 ml  Output 850 ml  Net -840 ml   Filed Weights   12/25/18 0500 12/26/18 0500 12/27/18 0500  Weight: 84.5 kg 82.3 kg 86.6 kg    Examination:  General exam: Generalized weakness, chronically ill, encephalopathic HEENT:PERRL,Oral mucosa moist, Ear/Nose normal on gross exam Respiratory system: Bilateral equal air entry, normal vesicular breath sounds, no wheezes or crackles  Cardiovascular system: S1 & S2 heard, RRR. Grade 2-3 systolic murmur.No JVD  No pedal  edema. Gastrointestinal system: Abdomen is nondistended, soft and nontender. No organomegaly or masses felt. Normal bowel sounds heard. Central nervous system:Not alert or oriented.  Generalized weakness.  Inappropriately moves her extremities  extremities: No edema, no clubbing ,no cyanosis, distal peripheral pulses palpable. Skin: No rashes, lesions or ulcers,no icterus ,no pallor   Data Reviewed: I have personally reviewed following labs and imaging studies  CBC: Recent Labs  Lab 12/21/18 0533 12/22/18 0631 12/24/18 0422 12/27/18 0624  WBC 6.9 5.3 5.9 7.3  NEUTROABS  --  3.3  --   --   HGB 8.9* 8.8* 9.4* 9.4*  HCT 28.2* 28.5* 30.9* 30.4*  MCV 96.2 96.3 96.3 93.5  PLT 232 272 317 902   Basic Metabolic Panel: Recent Labs  Lab 12/22/18 0631 12/23/18 0923 12/24/18 0422 12/25/18 1040 12/25/18 1625 12/26/18 0505 12/26/18 1700 12/27/18 0624  NA 138 138 135  --   --   --   --  137  K 3.4* 4.2 4.4  --   --   --   --  3.8  CL 102 104 100  --   --   --   --  101  CO2 27 29 28   --   --   --   --  25  GLUCOSE 120* 122* 123*  --   --   --   --  83  BUN 28* 23 26*  --   --   --   --  20  CREATININE 0.53 0.54 0.57  --   --   --   --  0.61  CALCIUM 8.5* 8.6* 8.9  --   --   --   --  9.1  MG  --   --  2.0 2.0 2.1 2.0 2.1  --   PHOS  --   --  3.7 4.4 3.8 3.3 3.3  --    GFR: Estimated Creatinine Clearance: 59 mL/min (by C-G formula based on SCr of 0.61 mg/dL). Liver Function Tests: Recent Labs  Lab 12/22/18 0631 12/27/18 0624  AST 111* 59*  ALT 88* 62*  ALKPHOS 61 84  BILITOT 0.3 0.4  PROT 5.3* 5.5*  ALBUMIN 1.6* 1.8*   No results for input(s): LIPASE, AMYLASE in the last 168 hours. No results for input(s): AMMONIA in the last 168 hours. Coagulation Profile: No results for input(s): INR, PROTIME in the last 168 hours. Cardiac Enzymes: No results for input(s): CKTOTAL, CKMB, CKMBINDEX, TROPONINI in the last 168 hours. BNP (last 3 results) No results for input(s): PROBNP  in the last 8760 hours. HbA1C: No results for input(s): HGBA1C in the last 72 hours. CBG: Recent Labs  Lab 12/26/18 1142 12/26/18 1629 12/26/18 2000 12/26/18 2343 12/27/18 0357  GLUCAP 84 91 84 89 79   Lipid Profile: No results for input(s): CHOL, HDL, LDLCALC, TRIG, CHOLHDL, LDLDIRECT in the last 72 hours. Thyroid Function Tests: No results for input(s): TSH, T4TOTAL, FREET4, T3FREE, THYROIDAB in the last 72 hours. Anemia Panel: No results for input(s): VITAMINB12, FOLATE, FERRITIN, TIBC, IRON, RETICCTPCT in the last 72 hours. Sepsis Labs: No results for input(s): PROCALCITON, LATICACIDVEN in the last 168 hours.  No results found for this or any previous visit (from the past 240 hour(s)).       Radiology Studies: Dg Abd 1 View  Result Date: 12/25/2018 CLINICAL DATA:  Nasogastric tube placement EXAM: ABDOMEN - 1 VIEW COMPARISON:  November 14, 2018 FINDINGS: Nasogastric tube tip is in the proximal stomach with the side port at the gastroesophageal junction. There is no bowel dilatation or air-fluid level to suggest bowel obstruction. No free air. There is atelectatic change in the left base. There is degenerative change in the lumbar spine. IMPRESSION: Nasogastric tube tip in proximal stomach with side port at gastroesophageal junction. Advise advancing nasogastric tube 5-6 cm. No bowel obstruction or free air evident.  Left base atelectasis. Electronically Signed   By: Lowella Grip III M.D.   On: 12/25/2018 20:23   Dg Abd 2 Views  Result Date: 12/27/2018 CLINICAL DATA:  NG tube placement EXAM: ABDOMEN - 2 VIEW COMPARISON:  12/26/2018 FINDINGS: NG tube tip is in the body of the stomach. The stomach is decompressed. There are no disproportionally dilated loops of bowel. There is no free intraperitoneal gas. Vascular calcifications are noted in the left upper quadrant and pelvis. Opacities at the left lung base are unchanged. IMPRESSION: Stable NG tube with its tip in the body of  the stomach. Nonobstructive bowel gas pattern. Electronically Signed   By: Marybelle Killings M.D.   On: 12/27/2018 10:35   Dg Abd Portable 1v  Result Date: 12/26/2018 CLINICAL DATA:  Nasogastric advancement. EXAM:  PORTABLE ABDOMEN - 1 VIEW COMPARISON:  12/25/2018 FINDINGS: Nasogastric tube tip is advanced slightly, with the tip in the mid body of the stomach. Side hole is probably at the gastroesophageal junction or just within the stomach. Abdominal gas pattern is unremarkable. IMPRESSION: Nasogastric tube advanced slightly with the tip in the mid body. Electronically Signed   By: Nelson Chimes M.D.   On: 12/26/2018 06:51        Scheduled Meds:  amLODipine  5 mg Per Tube Daily   chlorhexidine  15 mL Mouth Rinse BID   Chlorhexidine Gluconate Cloth  6 each Topical Daily   enoxaparin (LOVENOX) injection  80 mg Subcutaneous Q12H   feeding supplement (PRO-STAT SUGAR FREE 64)  60 mL Per Tube BID   mouth rinse  15 mL Mouth Rinse q12n4p   pantoprazole sodium  40 mg Per Tube Daily   phenytoin (DILANTIN) IV  100 mg Intravenous Q8H   potassium chloride  20 mEq Per Tube Daily   Continuous Infusions:  sodium chloride 10 mL/hr at 12/26/18 1200   acyclovir 700 mg (12/27/18 0555)   feeding supplement (JEVITY 1.2 CAL) 1,000 mL (12/26/18 0905)   lacosamide (VIMPAT) IV 200 mg (12/26/18 2220)   lactated ringers 10 mL/hr at 12/18/18 1900   levETIRAcetam 1,000 mg (12/27/18 0404)     LOS: 15 days    Time spent: 35 mins.More than 50% of that time was spent in counseling and/or coordination of care.      Shelly Coss, MD [facility] Pager [phone]  If 7PM-7AM, please contact night-coverage [email or web address] Password [facility] 12/27/2018, 11:44 AM

## 2018-12-27 NOTE — Progress Notes (Deleted)
Patient has been approved for CIR inpatient rehab. CSW informed MD.   CSW will continue to follow.   Domenic Schwab, MSW, [place]

## 2018-12-27 NOTE — Progress Notes (Signed)
  Speech Language Pathology Treatment: Dysphagia  Patient Details Name: Holly Cooper MRN: [id number] DOB: 1934-08-26 Today's Date: 12/27/2018 Time: 4315-4008 SLP Time Calculation (min) (ACUTE ONLY): 19 min  Assessment / Plan / Recommendation Clinical Impression  Pt presents with reversible oropharyngeal dysphagia characterized by decreased oral propulsion, suspected delay in the initiation of the swallow, but improved in that she did not exhibit multiple swallows with ice chips and/or 1/3 tsp amounts of thin liquids.  She also did not exhibit immediate and/or delayed coughing with limited thin liquids/ice chips during this trial.  Vocal quality with decreased intensity and weak projection, but improvement in voice overall noted. Nursing informed SLP, NG tube removed by pt and required re-insertion prior to session.  This is being utilized for medication and nutrition/hydration purposes.  ST will f/u for PO readiness and objective testing when pt is able to complete.  Ice chips allowed after oral care when pt alertness level adequate for consumption.  HPI HPI: 85 year old woman, history of CVA, history DVT/PE on Eliquis (question compliance). Experienced recurrent seizures that were treated initially with medazepam and then with Keppra. She was intubated for failure to protect her airway 3/28-4/8. MRI negative for acute infarct, per MD: Acute encephalopathy-working diagnosis of HSV encephalitis      SLP Plan  Continue with current plan of care       Recommendations  Diet recommendations: NPO;Other(comment)(ice chips allowed after oral care) Medication Administration: Via alternative means                Oral Care Recommendations: Oral care QID Follow up Recommendations: Skilled Nursing facility SLP Visit Diagnosis: Dysphagia, oropharyngeal phase (R13.12) Plan: Continue with current plan of care                       Elvina Sidle, M.S., CCC-SLP 12/27/2018, 1:43 PM

## 2018-12-28 LAB — GLUCOSE, CAPILLARY
Glucose-Capillary: 119 mg/dL — ABNORMAL HIGH (ref 70–99)
Glucose-Capillary: 101 mg/dL — ABNORMAL HIGH (ref 70–99)
Glucose-Capillary: 98 mg/dL (ref 70–99)
Glucose-Capillary: 79 mg/dL (ref 70–99)
Glucose-Capillary: 99 mg/dL (ref 70–99)

## 2018-12-28 NOTE — Progress Notes (Signed)
PROGRESS NOTE    Holly Cooper  WHQ:[id number] DOB: [birth year] DOA: 12/12/2018 PCP: Patient, No Pcp Per   Brief Narrative: Patient is 85 year old female with past medical history of CVA, history of DVT/PE on Eliquis with questionable compliance who presented to the emergency department with recurrent seizures.  She was admitted with status epilepticus.  She was intubated for failure to protect her airway.  She was initially admitted under PCCM service.  She has been extubated and handed over to hospitalist team on 12/27/2018.  Since he has been unable to tolerate anything by mouth,so  she has been started on tube feeding.  Physical therapy/Occupational Therapy have recommended her CIR on discharge.  Assessment & Plan:   Active Problems:   Seizures ([place])   Acute respiratory failure with hypoxia (HCC)   Encephalitis   Goals of care, counseling/discussion   Palliative care by specialist  Acute encephalopathy: Mental status improved today.She is alert and oriented to person and place.Obeys commands. Continue to monitor mental status.  Patient was treated empirically for HSV encephalitis.  Currently on acyclovir.  Day 14/14.  Plan is to complete 14 days of treatment.  Neurology following earlier.  LP was not possible after attempted but IR. Minimize sedating medications.  Status epilepticus: Presented with recurrent seizures.  Neurology was following.  Currently on IV Vimpat, Keppra and Dilantin.  No history of seizures in the past.  Neurology thought that her seizures are most likely associated with  her old stroke.  Neurology recommended to continue these antiseizure medications indefinitely and follow-up as an outpatient.We will change this seizures medications to oral when appropriate.  Acute respiratory failure: Required intubation to protect airway.  Finally extubated.  SLP following.  Respiratory status currently stable.  History of stroke: She has history of old strokes.  MRI of the brain  showed atrophy and chronic infarctions.  Neurology was following.  Inability to take orally/dysphagia: Speech therapy following.  Continue to recommend n.p.o.  Started on tube feeding.  If her mental status doesnot  improve, she might be a candidate for PEG placement.We  will discuss with family.Family anticipating spontaneous improvement.  History of DVT/PE/new acute DVT: Was on Eliquis at home but not sure whether she was compliant.  She has history of DVT and PE.  Doppler study done here showed acute DVT in the right axillary vein and right brachial vein.  She has been started on Lovenox here.  Will transition to Eliquis when appropriate.  Hypertension: Currently blood pressure soft. Held amlodipine.  Normocytic anemia: Continue to monitor H&H.  Debility/deconditioning: Physical therapy and occupational therapy evaluated the patient recommended CIR on discharge.  I have requested for CIR evaluation  Poor oral intake/multiple comorbidities: Palliative care following and discussing with family about goals of care.  Nutrition Problem: Inadequate oral intake Etiology: inability to eat      DVT prophylaxis:Lovenox Code Status: Full Family Communication: [employer] not received.Discussed briefly with another daughter Pamala Hurry yesterday Disposition Plan:  Most likely CIR.Awaiting spontaneous recovery on ability to take food orally.   Consultants: PCCM, neurology  Procedures: Intubation/extubation  Antimicrobials:  Anti-infectives (From admission, onward)   Start     Dose/Rate Route Frequency Ordered Stop   12/18/18 1800  vancomycin (VANCOCIN) IVPB 1000 mg/200 mL premix  Status:  Discontinued     1,000 mg 200 mL/hr over 60 Minutes Intravenous Every 12 hours 12/18/18 0912 12/18/18 0936   12/15/18 0600  vancomycin (VANCOCIN) IVPB 750 mg/150 ml premix  Status:  Discontinued  750 mg 150 mL/hr over 60 Minutes Intravenous Every 12 hours 12/14/18 1711 12/18/18 0912   12/14/18  1800  acyclovir (ZOVIRAX) 700 mg in dextrose 5 % 100 mL IVPB     700 mg 114 mL/hr over 60 Minutes Intravenous Every 8 hours 12/14/18 1711 12/28/18 2159   12/14/18 1800  ampicillin (OMNIPEN) 2 g in sodium chloride 0.9 % 100 mL IVPB  Status:  Discontinued     2 g 300 mL/hr over 20 Minutes Intravenous Every 4 hours 12/14/18 1711 12/18/18 0936   12/14/18 1800  cefTRIAXone (ROCEPHIN) 2 g in sodium chloride 0.9 % 100 mL IVPB  Status:  Discontinued     2 g 200 mL/hr over 30 Minutes Intravenous Every 12 hours 12/14/18 1711 12/18/18 0936   12/14/18 1800  vancomycin (VANCOCIN) 1,500 mg in sodium chloride 0.9 % 500 mL IVPB     1,500 mg 250 mL/hr over 120 Minutes Intravenous  Once 12/14/18 1711 12/14/18 2038      Subjective: Patient seen and examined the bedside this morning.  Looks comfortable.  Mental status improved today.  She is alert.  Oriented to place and person.  Obeys commands.. Objective: Vitals:   12/27/18 2331 12/28/18 0437 12/28/18 0629 12/28/18 0750  BP: 111/60 90/60  (!) 119/46  Pulse: 88 92  78  Resp: 17 19  18   Temp: 98.5 F (36.9 C) 100.2 F (37.9 C) 98.8 F (37.1 C) 98.7 F (37.1 C)  TempSrc: Axillary Axillary Axillary Axillary  SpO2: 100% 98%  100%  Weight:  84 kg    Height:        Intake/Output Summary (Last 24 hours) at 12/28/2018 1046 Last data filed at 12/28/2018 0800 Gross per 24 hour  Intake 175 ml  Output 500 ml  Net -325 ml   Filed Weights   12/26/18 0500 12/27/18 0500 12/28/18 0437  Weight: 82.3 kg 86.6 kg 84 kg    Examination:  General exam: Not in distress,weak elderly female HEENT:PERRL,Oral mucosa moist, Ear/Nose normal on gross exam Respiratory system: Bilateral equal air entry, normal vesicular breath sounds, no wheezes or crackles  Cardiovascular system: S1 & S2 heard, RRR. No JVD, murmurs, rubs, gallops or clicks. Gastrointestinal system: Abdomen is nondistended, soft and nontender. No organomegaly or masses felt. Normal bowel sounds heard.  Central nervous system: Alert and oriented to place and person. No focal neurological deficits.Obeys commands Extremities: No edema, no clubbing ,no cyanosis, distal peripheral pulses palpable. Skin: No rashes, lesions or ulcers,no icterus ,no pallor   Data Reviewed: I have personally reviewed following labs and imaging studies  CBC: Recent Labs  Lab 12/22/18 0631 12/24/18 0422 12/27/18 0624  WBC 5.3 5.9 7.3  NEUTROABS 3.3  --   --   HGB 8.8* 9.4* 9.4*  HCT 28.5* 30.9* 30.4*  MCV 96.3 96.3 93.5  PLT 272 317 101   Basic Metabolic Panel: Recent Labs  Lab 12/22/18 0631 12/23/18 0923 12/24/18 0422 12/25/18 1040 12/25/18 1625 12/26/18 0505 12/26/18 1700 12/27/18 0624  NA 138 138 135  --   --   --   --  137  K 3.4* 4.2 4.4  --   --   --   --  3.8  CL 102 104 100  --   --   --   --  101  CO2 27 29 28   --   --   --   --  25  GLUCOSE 120* 122* 123*  --   --   --   --  83  BUN 28* 23 26*  --   --   --   --  20  CREATININE 0.53 0.54 0.57  --   --   --   --  0.61  CALCIUM 8.5* 8.6* 8.9  --   --   --   --  9.1  MG  --   --  2.0 2.0 2.1 2.0 2.1  --   PHOS  --   --  3.7 4.4 3.8 3.3 3.3  --    GFR: Estimated Creatinine Clearance: 58.2 mL/min (by C-G formula based on SCr of 0.61 mg/dL). Liver Function Tests: Recent Labs  Lab 12/22/18 0631 12/27/18 0624  AST 111* 59*  ALT 88* 62*  ALKPHOS 61 84  BILITOT 0.3 0.4  PROT 5.3* 5.5*  ALBUMIN 1.6* 1.8*   No results for input(s): LIPASE, AMYLASE in the last 168 hours. No results for input(s): AMMONIA in the last 168 hours. Coagulation Profile: No results for input(s): INR, PROTIME in the last 168 hours. Cardiac Enzymes: No results for input(s): CKTOTAL, CKMB, CKMBINDEX, TROPONINI in the last 168 hours. BNP (last 3 results) No results for input(s): PROBNP in the last 8760 hours. HbA1C: No results for input(s): HGBA1C in the last 72 hours. CBG: Recent Labs  Lab 12/27/18 1648 12/27/18 1941 12/27/18 2319 12/28/18 0414  12/28/18 0748  GLUCAP 85 98 111* 119* 101*   Lipid Profile: No results for input(s): CHOL, HDL, LDLCALC, TRIG, CHOLHDL, LDLDIRECT in the last 72 hours. Thyroid Function Tests: No results for input(s): TSH, T4TOTAL, FREET4, T3FREE, THYROIDAB in the last 72 hours. Anemia Panel: No results for input(s): VITAMINB12, FOLATE, FERRITIN, TIBC, IRON, RETICCTPCT in the last 72 hours. Sepsis Labs: No results for input(s): PROCALCITON, LATICACIDVEN in the last 168 hours.  Recent Results (from the past 240 hour(s))  Culture, blood (routine x 2)     Status: None (Preliminary result)   Collection Time: 12/27/18  7:08 PM  Result Value Ref Range Status   Specimen Description BLOOD RIGHT FOOT  Final   Special Requests   Final    BOTTLES DRAWN AEROBIC AND ANAEROBIC Blood Culture adequate volume   Culture   Final    NO GROWTH < 12 HOURS Performed at [facility], [address] [place], [place] [address]    Report Status PENDING  Incomplete  Culture, blood (routine x 2)     Status: None (Preliminary result)   Collection Time: 12/27/18  7:13 PM  Result Value Ref Range Status   Specimen Description BLOOD LEFT FOOT  Final   Special Requests   Final    BOTTLES DRAWN AEROBIC ONLY Blood Culture adequate volume   Culture   Final    NO GROWTH < 12 HOURS Performed at [facility], [place] [address], [place], [place] [address]    Report Status PENDING  Incomplete         Radiology Studies: Dg Abd 2 Views  Result Date: 12/27/2018 CLINICAL DATA:  NG tube placement EXAM: ABDOMEN - 2 VIEW COMPARISON:  12/26/2018 FINDINGS: NG tube tip is in the body of the stomach. The stomach is decompressed. There are no disproportionally dilated loops of bowel. There is no free intraperitoneal gas. Vascular calcifications are noted in the left upper quadrant and pelvis. Opacities at the left lung base are unchanged. IMPRESSION: Stable NG tube with its tip in the body of the stomach. Nonobstructive bowel gas  pattern. Electronically Signed   By: Rodena Goldmann.D.  On: 12/27/2018 10:35        Scheduled Meds: . chlorhexidine  15 mL Mouth Rinse BID  . Chlorhexidine Gluconate Cloth  6 each Topical Daily  . enoxaparin (LOVENOX) injection  80 mg Subcutaneous Q12H  . feeding supplement (PRO-STAT SUGAR FREE 64)  60 mL Per Tube BID  . mouth rinse  15 mL Mouth Rinse q12n4p  . pantoprazole sodium  40 mg Per Tube Daily  . phenytoin (DILANTIN) IV  100 mg Intravenous Q8H  . potassium chloride  20 mEq Per Tube Daily   Continuous Infusions: . sodium chloride 10 mL/hr at 12/27/18 1900  . acyclovir 700 mg (12/28/18 0501)  . feeding supplement (JEVITY 1.2 CAL) 1,000 mL (12/26/18 0905)  . lacosamide (VIMPAT) IV 200 mg (12/28/18 1020)  . lactated ringers 10 mL/hr at 12/18/18 1900  . levETIRAcetam 1,000 mg (12/28/18 0948)     LOS: 16 days    Time spent: 35 mins.More than 50% of that time was spent in counseling and/or coordination of care.      Shelly Coss, MD [facility] Pager [phone]  If 7PM-7AM, please contact night-coverage [email or web address] Password [facility] 12/28/2018, 10:46 AM

## 2018-12-29 ENCOUNTER — Encounter (HOSPITAL_COMMUNITY): Payer: Self-pay | Admitting: Physical Medicine and Rehabilitation

## 2018-12-29 LAB — GLUCOSE, CAPILLARY
Glucose-Capillary: 105 mg/dL — ABNORMAL HIGH (ref 70–99)
Glucose-Capillary: 112 mg/dL — ABNORMAL HIGH (ref 70–99)
Glucose-Capillary: 126 mg/dL — ABNORMAL HIGH (ref 70–99)
Glucose-Capillary: 88 mg/dL (ref 70–99)
Glucose-Capillary: 93 mg/dL (ref 70–99)
Glucose-Capillary: 96 mg/dL (ref 70–99)

## 2018-12-29 LAB — PHENYTOIN LEVEL, TOTAL: Phenytoin Lvl: <2.5 ug/mL — ABNORMAL LOW (ref 10.0–20.0)

## 2018-12-29 MED ORDER — PANTOPRAZOLE SODIUM 40 MG PO TBEC
40.0000 mg | DELAYED_RELEASE_TABLET | Freq: Every day | ORAL | Status: DC
  Administered 2018-12-29 – 2018-12-30 (×2): 40 mg via ORAL
  Filled 2018-12-29 (×2): qty 1

## 2018-12-29 MED ORDER — ENSURE ENLIVE PO LIQD
237.0000 mL | Freq: Two times a day (BID) | ORAL | Status: DC
  Administered 2018-12-29 – 2018-12-30 (×2): 237 mL via ORAL

## 2018-12-29 MED ORDER — PHENYTOIN SODIUM EXTENDED 100 MG PO CAPS
100.0000 mg | ORAL_CAPSULE | Freq: Three times a day (TID) | ORAL | Status: DC
  Administered 2018-12-29 – 2018-12-30 (×3): 100 mg via ORAL
  Filled 2018-12-29 (×3): qty 1

## 2018-12-29 MED ORDER — APIXABAN 5 MG PO TABS
5.0000 mg | ORAL_TABLET | Freq: Two times a day (BID) | ORAL | Status: DC
  Administered 2018-12-29: 17:00:00 5 mg via ORAL
  Filled 2018-12-29 (×2): qty 1

## 2018-12-29 MED ORDER — LACOSAMIDE 200 MG PO TABS
200.0000 mg | ORAL_TABLET | Freq: Two times a day (BID) | ORAL | Status: DC
  Administered 2018-12-29 – 2018-12-30 (×2): 200 mg via ORAL
  Filled 2018-12-29 (×2): qty 1

## 2018-12-29 MED ORDER — LEVETIRACETAM 500 MG PO TABS
1000.0000 mg | ORAL_TABLET | Freq: Two times a day (BID) | ORAL | Status: DC
  Administered 2018-12-29 – 2018-12-30 (×2): 1000 mg via ORAL
  Filled 2018-12-29 (×2): qty 2

## 2018-12-29 NOTE — Progress Notes (Addendum)
Nutrition Follow-up   RD working remotely.  DOCUMENTATION CODES:   Obesity unspecified  INTERVENTION:  Provide Ensure Enlive po BID (thickened to nectar thick consistency), each supplement provides 350 kcal and 20 grams of protein.  Encourage adequate PO intake.   NUTRITION DIAGNOSIS:   Inadequate oral intake related to inability to eat as evidenced by NPO status; diet advanced; improving  GOAL:   Patient will meet greater than or equal to 90% of their needs; progressing  MONITOR:   PO intake, Supplement acceptance, Diet advancement, Weight trends, Labs, Skin, I & O's  REASON FOR ASSESSMENT:   Ventilator, Consult Enteral/tube feeding initiation and management  ASSESSMENT:   85 year old woman, history of CVA, history DVT/PE on Eliquis (question compliance). Experienced recurrent seizures that were treated initially with medazepam and then with Keppra.  She was intubated for failure to protect her airway.  Transfer is now to [facility] for further evaluation mechanically ventilated.  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 4/8 extubated  Diet has been advanced to a dysphagia 1 diet with nectar thick liquids. Meal completion 60% at lunch today. Tube feeding has been discontinued. NGT removed. RD to order nutritional supplements to aid in caloric and protein needs. Labs and medications reviewed.    Diet Order:   Diet Order            DIET - DYS 1 Room service appropriate? No; Fluid consistency: Nectar Thick  Diet effective now              EDUCATION NEEDS:   Not appropriate for education at this time  Skin:  Skin Assessment: Reviewed RN Assessment  Last BM:  4/11  Height:   Ht Readings from Last 1 Encounters:  12/13/18 5\' 6"  (1.676 m)    Weight:   Wt Readings from Last 1 Encounters:  12/29/18 86.5 kg    Ideal Body Weight:  59.1 kg  BMI:  Body mass index is 30.78 kg/m.  Estimated Nutritional Needs:   Kcal:  1600-1700  Protein:  85-100  grams  Fluid:  >1.6 L/day    Corrin Parker, MS, RD, LDN Pager # [phone] After hours/ weekend pager # [phone]

## 2018-12-29 NOTE — Consult Note (Signed)
Physical Medicine and Rehabilitation Consult   Reason for Consult: Functional deficits due to encephalopathy.  Referring Physician: Dr. Tawanna Solo.    HPI: Holly Cooper is a 85 y.o. female with history of CVA,  interstitial pulmonary fibrosis, DVT/PE 12/19--on Eliquis  who was admitted on 12/12/18 after found unresponsive at home with right gaze deviation. She was had focal seizures enroute to ER treated with versed. She developed respiratory arrest requiring intubation in ED. CTA head without evidence of LVO but she continued to have multiple seizures therefore loaded with Keppra and transferred to [facility] for management seizures. Dr. Leonel Ramsay consulted for input and recommended continuous EEG for monitoring due to status epilepticus.  She was loaded with fosphenytoin and Vimpat added with resolution of seizures. Due to leucocytosis and concerns of HSV encephalitis, she was started on acyclovir as well as antibiotics for bacterial coverage.  MRI brain limited but showed moderate cerebral atrophy and was negative for acute changes.    She was found to have acute DVT right axillary/brachial veins and right basilic/cephaic veins--started on Lovenox on 04/4.  She tolerated extubation by 4/8 and respiratory status stable.  Tube feeds started due to signs of reversible dysphagia. She developed fevers 4/11 and BC X 2 negative without leucocytosis therefore being monitored. GOC discussed with family and to continue full scope of treatment. Beside swallow done and to start dysphagia 1, nectars today. She is showing improvement in mentation but continues to be limited by confusion, difficulty following commands, garbled speech and severe debility.  Therapy evaluations completed and CIR recommended due to functional decline.     Review of Systems  Unable to perform ROS: Mental acuity     Past Medical History:  Diagnosis Date  . Colon cancer ([place])   . Dyslipidemia   . Gallstones   . Hematuria   .  Interstitial pulmonary fibrosis ([place])   . Lumbar spondylosis    with LLE radiculopathy  . Pulmonary embolism ([place]) 32/44/0102   complicated by acute respiratory failure  . Renal cyst   . Stroke (cerebrum) ([place]) 2018    Past Surgical History:  Procedure Laterality Date  . COLON SURGERY     for cancer  . hysterectomy      Family History  Problem Relation Age of Onset  . Stroke Father     Social History: Lives with family.  No history on file for tobacco, alcohol, and drug.    Allergies  Allergen Reactions  . Atorvastatin Other (See Comments)    Muscle aches - back     Medications Prior to Admission  Medication Sig Dispense Refill  . lisinopril-hydrochlorothiazide (PRINZIDE,ZESTORETIC) 20-25 MG tablet Take 1 tablet by mouth daily.    . ondansetron (ZOFRAN) 8 MG tablet Take 8 mg by mouth every 8 (eight) hours as needed for nausea or vomiting.    . warfarin (COUMADIN) 1 MG tablet Take 0.5 mg by mouth daily. Take 1/2 tablet (0.5 mg) by mouth with a 2 mg tablets for a total daily dose of 2.5 mg    . warfarin (COUMADIN) 2 MG tablet Take 2 mg by mouth daily. Take 1 tablet (2 mg) with 1/2 1 mg tablet (0.5 mg) for a total daily dose of 2.5 mg      Home: Home Living Family/patient expects to be discharged to:: Unsure Living Arrangements: Other relatives Additional Comments: Pt unable to provide home living set up  Functional History: Prior Function Level of Independence: Needs assistance Gait /  Transfers Assistance Needed: uses walker per chart review Functional Status:  Mobility: Bed Mobility Overal bed mobility: Needs Assistance Bed Mobility: Supine to Sit, Sit to Supine Rolling: +2 for safety/equipment, Total assist Sidelying to sit: +2 for physical assistance, Total assist Supine to sit: Max assist Sit to supine: Max assist, +2 for physical assistance General bed mobility comments: TotalA + 2 for all aspects of bed mobility. Pt with no initiation of movement Transfers  Overall transfer level: Needs assistance Equipment used: 2 person hand held assist Transfers: Sit to/from Stand, Stand Pivot Transfers Sit to Stand: +2 physical assistance, +2 safety/equipment, Max assist Stand pivot transfers: Max assist, +2 physical assistance, +2 safety/equipment General transfer comment: use of two person face to face method, max multimodal cues to have pt hold onto therapist's arms; requires facilitation/assist to achieve full upright standing posture and to facilitate stepping LEs to turn/pivot towards recliner, pivoting towards the R side       ADL: ADL Overall ADL's : Needs assistance/impaired Eating/Feeding: Set up, Minimal assistance, Sitting Grooming: Minimal assistance, Moderate assistance, Sitting, Brushing hair Grooming Details (indicate cue type and reason): hand over hand assist to initiate task and support provided at elbow so that pt can reach her head, pt able to brush the front with assist to support UE and requires assist to brush the back Upper Body Bathing: Minimal assistance, Moderate assistance, Sitting Lower Body Bathing: Maximal assistance, +2 for physical assistance, +2 for safety/equipment, Sit to/from stand Upper Body Dressing : Moderate assistance, Sitting Upper Body Dressing Details (indicate cue type and reason): assist to don new gown Lower Body Dressing: Maximal assistance, Total assistance, +2 for physical assistance, +2 for safety/equipment, Sit to/from stand Toilet Transfer: Maximal assistance, +2 for physical assistance, +2 for safety/equipment, Stand-pivot Toilet Transfer Details (indicate cue type and reason): simulated via transfer to recliner Toileting- Clothing Manipulation and Hygiene: Total assistance, +2 for physical assistance, +2 for safety/equipment, Bed level Toileting - Clothing Manipulation Details (indicate cue type and reason): pt incontinent of bowel start of session; requires totalA+2 for rolling L/R and for peri-care   Functional mobility during ADLs: Maximal assistance, +2 for physical assistance, +2 for safety/equipment  Cognition: Cognition Overall Cognitive Status: Impaired/Different from baseline Orientation Level: Oriented to person Cognition Arousal/Alertness: Awake/alert Behavior During Therapy: WFL for tasks assessed/performed, Impulsive Overall Cognitive Status: Impaired/Different from baseline Area of Impairment: Orientation, Attention, Memory, Following commands, Safety/judgement, Awareness Orientation Level: Disoriented to, Place, Time, Situation Current Attention Level: Focused Memory: Decreased short-term memory Following Commands: Follows one step commands inconsistently, Follows one step commands with increased time Safety/Judgement: Decreased awareness of deficits Awareness: Intellectual General Comments: Increased confusion, with incomprehensible speech. Following simple commands < 25% of the time    Blood pressure 117/65, pulse 81, temperature 99 F (37.2 C), temperature source Oral, resp. rate 20, height 5\' 6"  (1.676 m), weight 86.5 kg, SpO2 98 %. Physical Exam  Nursing note and vitals reviewed. Constitutional: She appears well-developed and well-nourished. No distress.  Panda in place with tube feeds running  HENT:    NGT, edentulous  Eyes: Pupils are equal, round, and reactive to light.  Neck: Normal range of motion.  Cardiovascular:  Murmur heard. Respiratory: Effort normal.  GI: Soft.  Musculoskeletal: Normal range of motion.     Comments: Resolving ecchymosis left knee.   Neurological: She is alert.  Garbled speech with echolaly? She was able to state her name and say 'bye". Told me her name was Holly Cooper. She was unable to choose place  with choice of two or follow simple commands--distracted by bilateral mittens. She was able to point to items. RUE weakness noted but able to move RUE and RLE with cueing. Moves left side more spontaneously. Senses pain in all 4's.    Skin: Skin is warm. She is not diaphoretic.  Psychiatric:  flat    Results for orders placed or performed during the hospital encounter of 12/12/18 (from the past 24 hour(s))  Glucose, capillary     Status: None   Collection Time: 12/28/18 11:52 AM  Result Value Ref Range   Glucose-Capillary 98 70 - 99 mg/dL   Comment 1 Notify RN    Comment 2 Document in Chart   Glucose, capillary     Status: None   Collection Time: 12/28/18  4:50 PM  Result Value Ref Range   Glucose-Capillary 79 70 - 99 mg/dL   Comment 1 Notify RN    Comment 2 Document in Chart   Glucose, capillary     Status: None   Collection Time: 12/28/18 11:07 PM  Result Value Ref Range   Glucose-Capillary 99 70 - 99 mg/dL  Glucose, capillary     Status: None   Collection Time: 12/29/18 12:18 AM  Result Value Ref Range   Glucose-Capillary 93 70 - 99 mg/dL  Glucose, capillary     Status: Abnormal   Collection Time: 12/29/18  4:10 AM  Result Value Ref Range   Glucose-Capillary 112 (H) 70 - 99 mg/dL  Phenytoin level, total     Status: Abnormal   Collection Time: 12/29/18  5:10 AM  Result Value Ref Range   Phenytoin Lvl <2.5 (L) 10.0 - 20.0 ug/mL  Glucose, capillary     Status: Abnormal   Collection Time: 12/29/18  7:58 AM  Result Value Ref Range   Glucose-Capillary 105 (H) 70 - 99 mg/dL   Comment 1 Notify RN    Comment 2 Document in Chart    Dg Abd 2 Views  Result Date: 12/27/2018 CLINICAL DATA:  NG tube placement EXAM: ABDOMEN - 2 VIEW COMPARISON:  12/26/2018 FINDINGS: NG tube tip is in the body of the stomach. The stomach is decompressed. There are no disproportionally dilated loops of bowel. There is no free intraperitoneal gas. Vascular calcifications are noted in the left upper quadrant and pelvis. Opacities at the left lung base are unchanged. IMPRESSION: Stable NG tube with its tip in the body of the stomach. Nonobstructive bowel gas pattern. Electronically Signed   By: Marybelle Killings M.D.   On: 12/27/2018 10:35      Assessment/Plan: Diagnosis: 85 yo female diagnosed with presumptive HSV encephalitis, seizures with associated encephalopathy 1. Does the need for close, 24 hr/day medical supervision in concert with the patient's rehab needs make it unreasonable for this patient to be served in a less intensive setting? Yes 2. Co-Morbidities requiring supervision/potential complications: dysphagia, HTN, new DVT RUE 3. Due to bladder management, bowel management, safety, skin/wound care, disease management, medication administration, pain management and patient education, does the patient require 24 hr/day rehab nursing? Yes 4. Does the patient require coordinated care of a physician, rehab nurse, PT (1-2 hrs/day, 5 days/week), OT (1-2 hrs/day, 5 days/week) and SLP (1-2 hrs/day, 5 days/week) to address physical and functional deficits in the context of the above medical diagnosis(es)? Yes Addressing deficits in the following areas: balance, endurance, locomotion, strength, transferring, bowel/bladder control, bathing, dressing, feeding, grooming, toileting, cognition, speech, language, swallowing and psychosocial support 5. Can the patient actively participate in an  intensive therapy program of at least 3 hrs of therapy per day at least 5 days per week? Potentially 6. The potential for patient to make measurable gains while on inpatient rehab is good 7. Anticipated functional outcomes upon discharge from inpatient rehab are supervision and min assist  with PT, supervision and min assist with OT, supervision and min assist with SLP. 8. Estimated rehab length of stay to reach the above functional goals is: 18-24 days 9. Anticipated D/C setting: Home 10. Anticipated post D/C treatments: [place] therapy 11. Overall Rehab/Functional Prognosis: good  RECOMMENDATIONS: This patient's condition is appropriate for continued rehabilitative care in the following setting: potentially CIR Patient has agreed to participate in  recommended program. N/A Note that insurance prior authorization may be required for reimbursement for recommended care.  Comment: Pt will need substantial assistance upon discharge to home. Rehab Admissions Coordinator to follow up.  Thanks,  Meredith Staggers, MD, Mellody Drown  I have personally performed a face to face diagnostic evaluation of this patient. Additionally, I have examined pertinent labs and radiographic images. I have reviewed and concur with the physician assistant's documentation above.    Bary Leriche, PA-C 12/29/2018

## 2018-12-29 NOTE — Discharge Instructions (Addendum)
Information on my medicine - SAVAYSA® (edoxaban) ° °This medication education was reviewed with me or my healthcare representative as part of my discharge preparation.   ° °WHY WAS SAVAYSA® PRESCRIBED FOR YOU? °Savaysa® was prescribed to treat blood clots that may have been found in the veins of your legs (deep vein thrombosis) or in your lungs (pulmonary embolism) and to reduce the risk of them occurring again. ° °WHAT DO YOU NEED TO KNOW ABOUT SAVAYSA® ? °Take your Savaysa® ONCE DAILY - one tablet in the morning with or without food.  It would be best to take the doses about the same time each day. ° °If you have difficulty swallowing the tablet whole please discuss with your pharmacist how to take the medication safely. ° °Take Savaysa® exactly as prescribed by your doctor and DO NOT stop taking Savaysa® without talking to the doctor who prescribed the medication.  Stopping may increase your risk of developing a new clot or stroke.  Refill your prescription before you run out. ° °After discharge, you should have regular check-up appointments with your healthcare provider that is prescribing your Savaysa®.  In the future your dose may need to be changed if your kidney function or weight changes by a significant amount or as you get older. ° °WHAT DO YOU DO IF YOU MISS A DOSE? °If you are taking Savaysa® ONCE DAILY and you miss a dose, take it as soon as you remember on the same day then continue your regularly scheduled once daily regimen the next day. Do not take two doses of Savaysa® at the same time or on the same day. ° °IMPORTANT SAFETY INFORMATION °A possible side effect of Savaysa® is bleeding. You should call your healthcare provider right away if you experience any of the following: °? Bleeding from an injury or your nose that does not stop. °? Unusual colored urine (red or dark brown) or unusual colored stools (red or black). °? Unusual bruising for unknown reasons. °? A serious fall or if you hit your  head (even if there is no bleeding). ° °Some medicines may interact with Savaysa® and might increase your risk of bleeding or clotting while on Savaysa®. To help avoid this, consult your healthcare provider or pharmacist prior to using any new prescription or non-prescription medications, including herbals, vitamins, non-steroidal anti-inflammatory drugs (NSAIDs) and supplements. ° °This website has more information on Savaysa® (edoxaban): http://www.savaysa.com °

## 2018-12-29 NOTE — Progress Notes (Addendum)
PROGRESS NOTE    Holly Cooper  OVF:[id number] DOB: May 22, 1934 DOA: 12/12/2018 PCP: Patient, No Pcp Per   Brief Narrative: Patient is 85 year old female with past medical history of CVA, history of DVT/PE on Eliquis with questionable compliance who presented to the emergency department with recurrent seizures.  She was admitted with status epilepticus.  She was intubated for failure to protect her airway.  She was initially admitted under PCCM service.  She has been extubated and handed over to hospitalist team on 12/27/2018.  Since he has been unable to tolerate anything by mouth,so  she was started on tube feeding.She has been cleared by speech to start on dysphagia 1 diet today.  Physical therapy/Occupational Therapy have recommended her CIR on discharge.  Assessment & Plan:   Active Problems:   Seizures ([place])   Acute respiratory failure with hypoxia ([facility])   Encephalitis   Goals of care, counseling/discussion   Palliative care by specialist  Acute encephalopathy: Mental status remains  improved .She is alert and oriented to person and place.Obeys commands. Continue to monitor mental status.  Patient was treated empirically for HSV encephalitis.  Completed course of  acyclovir.    Neurology was  following earlier.  LP was not possible after attempted but IR.  Status epilepticus: Presented with recurrent seizures.  Neurology was following.  Currently on oral Vimpat, Keppra and Dilantin.  No history of seizures in the past.  Neurology thought that her seizures are most likely associated with  her old stroke.  Neurology recommended to continue these antiseizure medications indefinitely and follow-up as an outpatient.  Acute respiratory failure: Required intubation to protect airway.  Finally extubated.  SLP following.  Respiratory status currently stable.  History of stroke: She has history of old strokes.  MRI of the brain showed atrophy and chronic infarctions.  Neurology was following.   Inability to take orally/dysphagia: Speech therapy following.  She was initially started on tube feeding. Started on dysphagia 1 diet today.  History of DVT/PE/new acute DVT: Was on Eliquis at home but not sure whether she was compliant.  She has history of DVT and PE.  Doppler study done here showed acute DVT in the right axillary vein and right brachial vein.  She was started on Lovenox here.  Will transition to Eliquis now.  Hypertension: Currently blood pressure soft. Held amlodipine.  Normocytic anemia: Continue to monitor H&H.  Debility/deconditioning: Physical therapy and occupational therapy evaluated the patient recommended CIR on discharge.   Poor oral intake/multiple comorbidities: Palliative care following and discussing with family about goals of care.  Nutrition Problem: Inadequate oral intake Etiology: inability to eat      DVT prophylaxis:Lovenox Code Status: Full Family Communication: Discussed with son in law on phone Disposition Plan:  CIR   Consultants: PCCM, neurology  Procedures: Intubation/extubation  Antimicrobials:  Anti-infectives (From admission, onward)   Start     Dose/Rate Route Frequency Ordered Stop   12/18/18 1800  vancomycin (VANCOCIN) IVPB 1000 mg/200 mL premix  Status:  Discontinued     1,000 mg 200 mL/hr over 60 Minutes Intravenous Every 12 hours 12/18/18 0912 12/18/18 0936   12/15/18 0600  vancomycin (VANCOCIN) IVPB 750 mg/150 ml premix  Status:  Discontinued     750 mg 150 mL/hr over 60 Minutes Intravenous Every 12 hours 12/14/18 1711 12/18/18 0912   12/14/18 1800  acyclovir (ZOVIRAX) 700 mg in dextrose 5 % 100 mL IVPB     700 mg 114 mL/hr over 60 Minutes Intravenous  Every 8 hours 12/14/18 1711 12/28/18 2202   12/14/18 1800  ampicillin (OMNIPEN) 2 g in sodium chloride 0.9 % 100 mL IVPB  Status:  Discontinued     2 g 300 mL/hr over 20 Minutes Intravenous Every 4 hours 12/14/18 1711 12/18/18 0936   12/14/18 1800  cefTRIAXone (ROCEPHIN)  2 g in sodium chloride 0.9 % 100 mL IVPB  Status:  Discontinued     2 g 200 mL/hr over 30 Minutes Intravenous Every 12 hours 12/14/18 1711 12/18/18 0936   12/14/18 1800  vancomycin (VANCOCIN) 1,500 mg in sodium chloride 0.9 % 500 mL IVPB     1,500 mg 250 mL/hr over 120 Minutes Intravenous  Once 12/14/18 1711 12/14/18 2038      Subjective: Patient seen and examined the bedside this morning.  Remains hemodynamically stable.  Comfortable.  She worked with speech therapy today.  Started on dysphagia 1 diet.   Objective:   Vitals:   12/29/18 0412 12/29/18 0500 12/29/18 0801 12/29/18 1127  BP: 134/89  117/65 (!) 129/55  Pulse: 92  81 86  Resp: 16  20 20   Temp: 99.9 F (37.7 C)  99 F (37.2 C) 98 F (36.7 C)  TempSrc: Oral  Oral Oral  SpO2: 100%  98% 95%  Weight:  86.5 kg    Height:        Intake/Output Summary (Last 24 hours) at 12/29/2018 1205 Last data filed at 12/29/2018 0300 Gross per 24 hour  Intake 1953.49 ml  Output -  Net 1953.49 ml   Filed Weights   12/27/18 0500 12/28/18 0437 12/29/18 0500  Weight: 86.6 kg 84 kg 86.5 kg    Examination:   General exam: Not in distress,elderly female HEENT:PERRL,Oral mucosa moist, Ear/Nose normal on gross exam Respiratory system: Bilateral equal air entry, normal vesicular breath sounds, no wheezes or crackles  Cardiovascular system: S1 & S2 heard, RRR. No JVD, murmurs, rubs, gallops or clicks. Gastrointestinal system: Abdomen is nondistended, soft and nontender. No organomegaly or masses felt. Normal bowel sounds heard. Central nervous system: Alert and oriented to place and person No focal neurological deficits. Extremities: No edema, no clubbing ,no cyanosis, distal peripheral pulses palpable. Skin: No rashes, lesions or ulcers,no icterus ,no pallor   Data Reviewed: I have personally reviewed following labs and imaging studies  CBC: Recent Labs  Lab 12/24/18 0422 12/27/18 0624  WBC 5.9 7.3  HGB 9.4* 9.4*  HCT 30.9*  30.4*  MCV 96.3 93.5  PLT 317 409   Basic Metabolic Panel: Recent Labs  Lab 12/23/18 0923 12/24/18 0422 12/25/18 1040 12/25/18 1625 12/26/18 0505 12/26/18 1700 12/27/18 0624  NA 138 135  --   --   --   --  137  K 4.2 4.4  --   --   --   --  3.8  CL 104 100  --   --   --   --  101  CO2 29 28  --   --   --   --  25  GLUCOSE 122* 123*  --   --   --   --  83  BUN 23 26*  --   --   --   --  20  CREATININE 0.54 0.57  --   --   --   --  0.61  CALCIUM 8.6* 8.9  --   --   --   --  9.1  MG  --  2.0 2.0 2.1 2.0 2.1  --  PHOS  --  3.7 4.4 3.8 3.3 3.3  --    GFR: Estimated Creatinine Clearance: 59 mL/min (by C-G formula based on SCr of 0.61 mg/dL). Liver Function Tests: Recent Labs  Lab 12/27/18 0624  AST 59*  ALT 62*  ALKPHOS 84  BILITOT 0.4  PROT 5.5*  ALBUMIN 1.8*   No results for input(s): LIPASE, AMYLASE in the last 168 hours. No results for input(s): AMMONIA in the last 168 hours. Coagulation Profile: No results for input(s): INR, PROTIME in the last 168 hours. Cardiac Enzymes: No results for input(s): CKTOTAL, CKMB, CKMBINDEX, TROPONINI in the last 168 hours. BNP (last 3 results) No results for input(s): PROBNP in the last 8760 hours. HbA1C: No results for input(s): HGBA1C in the last 72 hours. CBG: Recent Labs  Lab 12/28/18 1650 12/28/18 2307 12/29/18 0018 12/29/18 0410 12/29/18 0758  GLUCAP 79 99 93 112* 105*   Lipid Profile: No results for input(s): CHOL, HDL, LDLCALC, TRIG, CHOLHDL, LDLDIRECT in the last 72 hours. Thyroid Function Tests: No results for input(s): TSH, T4TOTAL, FREET4, T3FREE, THYROIDAB in the last 72 hours. Anemia Panel: No results for input(s): VITAMINB12, FOLATE, FERRITIN, TIBC, IRON, RETICCTPCT in the last 72 hours. Sepsis Labs: No results for input(s): PROCALCITON, LATICACIDVEN in the last 168 hours.  Recent Results (from the past 240 hour(s))  Culture, blood (routine x 2)     Status: None (Preliminary result)   Collection Time:  12/27/18  7:08 PM  Result Value Ref Range Status   Specimen Description BLOOD RIGHT FOOT  Final   Special Requests   Final    BOTTLES DRAWN AEROBIC AND ANAEROBIC Blood Culture adequate volume   Culture   Final    NO GROWTH 2 DAYS Performed at [facility], [address], [place], [place] [address]    Report Status PENDING  Incomplete  Culture, blood (routine x 2)     Status: None (Preliminary result)   Collection Time: 12/27/18  7:13 PM  Result Value Ref Range Status   Specimen Description BLOOD LEFT FOOT  Final   Special Requests   Final    BOTTLES DRAWN AEROBIC ONLY Blood Culture adequate volume   Culture   Final    NO GROWTH 2 DAYS Performed at [facility], [place] [address] [place],  [address]    Report Status PENDING  Incomplete         Radiology Studies: No results found.      Scheduled Meds: . apixaban  5 mg Oral BID  . chlorhexidine  15 mL Mouth Rinse BID  . Chlorhexidine Gluconate Cloth  6 each Topical Daily  . lacosamide  200 mg Oral BID  . levETIRAcetam  1,000 mg Oral BID  . mouth rinse  15 mL Mouth Rinse q12n4p  . pantoprazole  40 mg Oral Daily  . phenytoin  100 mg Oral TID   Continuous Infusions: . sodium chloride 10 mL/hr at 12/27/18 1900  . lactated ringers 10 mL/hr at 12/18/18 1900     LOS: 17 days    Time spent: 35 mins.More than 50% of that time was spent in counseling and/or coordination of care.      Shelly Coss, MD [facility] Pager [phone]  If 7PM-7AM, please contact night-coverage [email or web address] Password [facility] 12/29/2018, 12:05 PM

## 2018-12-29 NOTE — Progress Notes (Signed)
NGT REMOVED WITHOUT COMPLICATION OR INCIDENT. PO MEDS ADMIN CRUSHED IN APPLESAUCE WITHOUT DIFFICULTY. ASPIRATION PRECAUTIONS OBSERVED.

## 2018-12-29 NOTE — Progress Notes (Signed)
ANTICOAGULATION CONSULT NOTE - Follow Up Consult  Pharmacy Consult for Lovenox Indication: acute DVT   Allergies  Allergen Reactions  . Atorvastatin Other (See Comments)    Muscle aches - back     Patient Measurements: Height: 5\' 6"  (167.6 cm)(from 11/14/18 encounter) Weight: 190 lb 11.2 oz (86.5 kg) IBW/kg (Calculated) : 59.3 Lovenox Dosing Weight: 82 kg  Vital Signs: Temp: 99 F (37.2 C) (04/13 0801) Temp Source: Oral (04/13 0801) BP: 117/65 (04/13 0801) Pulse Rate: 81 (04/13 0801)  Labs: Recent Labs    12/27/18 0624  HGB 9.4*  HCT 30.4*  PLT 382  CREATININE 0.61    Estimated Creatinine Clearance: 59 mL/min (by C-G formula based on SCr of 0.61 mg/dL).  Assessment:  78 YOF presented on 12/12/18 with seizure and acute respiratory failure.  Patient has a history of DVT/PE on Coumadin PTA, but compliance was quesitonable, as INR was subtherapeutic(1.4) on admit.  Doppler 12/20/18 showed acute DVT involving the right axillary/brachial veins and acute superficial vein thrombosis of right basilic/cephalic veins. Pharmacy consulted to dose Lovenox for VTE treatment.    Goal of Therapy:  Anti-Xa level 0.6-1 units/ml 4hrs after LMWH dose given Monitor platelets by anticoagulation protocol: Yes   Plan:  Continue Lovenox 80 mg sq q12hr CBC q72hs while on Lovenox. Monitor daily weights  Follow up for oral/per tube anticoagulation.  Albertina Parr, PharmD., BCPS Clinical Pharmacist Please refer to [facility] for unit-specific pharmacist

## 2018-12-29 NOTE — Progress Notes (Signed)
  Speech Language Pathology Treatment: Dysphagia  Patient Details Name: Holly Cooper MRN: [id number] DOB: 1934-04-12 Today's Date: 12/29/2018 Time: 2202-5427 SLP Time Calculation (min) (ACUTE ONLY): 35 min  Assessment / Plan / Recommendation Clinical Impression  Pt seen at bedside to assess readiness for po intake. Pt was noted to be sleeping upon arrival of SLP, but roused easily with verbal and tactile stim. Oral care completed with suction. Significant thick secretions were removed via suction, which changed pt voice quality considerably. Following oral care, pt accepted trials of ice chips, thin liquid, nectar thick liquid, and puree. Slightly extended oral prep of ice chip, possibly due to being edentulous. No obvious oral deficits or residue noted on any consistency. Change to wet voice quality noted following thin liquid trials only. This may be due at least in part to presence of NGT interfering with epiglottic inversion and subsequent airway closure during the swallow. Pt appears to have made improvement since initial SLP evaluation, based on notes in chart, however, she remains at increased risk for aspiration given cognitive impairment, history of CVA, and extended intubation (12 days).   Recommend removal of NGT and initiation of conservative diet (puree/nectar thick liquids) with crushed meds. Oral care with suction is recommended prior to and following po intake. SLP will follow up at bedside to assess diet tolerance and  provide education. RN and MD informed of results and recommendations.   HPI HPI: 85 year old woman, history of CVA, history DVT/PE on Eliquis (question compliance). Experienced recurrent seizures that were treated initially with medazepam and then with Keppra. She was intubated for failure to protect her airway 3/28-4/8. MRI negative for acute infarct, per MD: Acute encephalopathy-working diagnosis of HSV encephalitis      SLP Plan  Continue with current plan of  care       Recommendations  Diet recommendations: Dysphagia 1 (puree);Nectar-thick liquid Liquids provided via: Straw Medication Administration: Crushed with puree Supervision: Full supervision/cueing for compensatory strategies;Trained caregiver to feed patient Compensations: Minimize environmental distractions;Slow rate;Small sips/bites Postural Changes and/or Swallow Maneuvers: Seated upright 90 degrees;Upright 30-60 min after meal                Oral Care Recommendations: Oral care QID Follow up Recommendations: Skilled Nursing facility;24 hour supervision/assistance SLP Visit Diagnosis: Dysphagia, oropharyngeal phase (R13.12) Plan: Continue with current plan of care       Holly Cooper [facility], [place] Speech Language Pathologist [phone]  Shonna Chock 12/29/2018, 9:36 AM

## 2018-12-29 NOTE — Progress Notes (Addendum)
Occupational Therapy Treatment Patient Details Name: Holly Cooper MRN: [id number] DOB: 1934-04-19 Today's Date: 12/29/2018    History of present illness Pt is a 85 y.o. 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 Also found to have RUE DVT.   OT comments  Patient supine in bed and agreeable to OT/PT cotx.  Patient requires total assist for all bed mobility today, limited to sitting EOB as presents with difficulty following commands, fatigued with R lateral lean with minimal sitting EOB.  Returned to supine and requires total assist for toileting peri care, incontinent of bowel/urine. Maintains oriented to self only, verbalizing throughout session but at times difficult to understand.  DC plan updated to SNF, will re-evaluate goals next session if not progressing.    Follow Up Recommendations  SNF;Supervision/Assistance - 24 hour    Equipment Recommendations  3 in 1 bedside commode;Other (comment)(TBD at next venue of care)    Recommendations for Other Services      Precautions / Restrictions Precautions Precautions: Fall Restrictions Weight Bearing Restrictions: No       Mobility Bed Mobility Overal bed mobility: Needs Assistance Bed Mobility: Rolling;Sidelying to Sit;Sit to Supine Rolling: Total assist;+2 for physical assistance   Supine to sit: Total assist;+2 for physical assistance Sit to supine: Total assist;+2 for physical assistance   General bed mobility comments: pt requires total A +2 for all aspects of bed mobility; initially pt assiting to bring bilat LE to EOB however than stopped assisting or following cues; pt grasping onto therapist and bed rails  Transfers                 General transfer comment: pt with heavy R lateral lean in sitting EOB and unable to progress to standing this session    Balance Overall balance assessment: Needs  assistance Sitting-balance support: Feet supported;Single extremity supported;Bilateral upper extremity supported Sitting balance-Leahy Scale: Poor Sitting balance - Comments: initially pt maintained sitting balance EOB with min guard/min A but with fatigue pt demonstrates heavy R lateral lean and requires assistance to maintain sitting balance with multimodal cues                                   ADL either performed or assessed with clinical judgement   ADL Overall ADL's : Needs assistance/impaired                 Upper Body Dressing : Maximal assistance;Bed level Upper Body Dressing Details (indicate cue type and reason): assist to don new gown  Lower Body Dressing: Total assistance Lower Body Dressing Details (indicate cue type and reason): to don socks, supine     Toileting- Clothing Manipulation and Hygiene: Total assistance;+2 for physical assistance;Bed level Toileting - Clothing Manipulation Details (indicate cue type and reason): pt incontinent of bowel/bladder at beginning of session; requires total assist +2 for rolling L/R for peri care     Functional mobility during ADLs: Total assistance;+2 for physical assistance       Vision       Perception     Praxis      Cognition Arousal/Alertness: Awake/alert Behavior During Therapy: [facility] for tasks assessed/performed;Impulsive Overall Cognitive Status: Impaired/Different from baseline Area of Impairment: Orientation;Attention;Memory;Following commands;Safety/judgement;Awareness                 Orientation Level: Disoriented to;Place;Time;Situation Current  Attention Level: Focused Memory: Decreased short-term memory Following Commands: Follows one step commands inconsistently;Follows one step commands with increased time Safety/Judgement: Decreased awareness of deficits Awareness: Intellectual   General Comments: Increased confusion, with incomprehensible speech. Following simple commands  < 25% of the time, oriented to self only.         Exercises     Shoulder Instructions       General Comments      Pertinent Vitals/ Pain       Pain Assessment: Faces Faces Pain Scale: Hurts little more Pain Location: unable to specify; grimacing with mobility Pain Descriptors / Indicators: Grimacing Pain Intervention(s): Limited activity within patient's tolerance;Repositioned;Monitored during session  Home Living                                          Prior Functioning/Environment              Frequency  Min 2X/week        Progress Toward Goals  OT Goals(current goals can now be found in the care plan section)  Progress towards OT goals: Not progressing toward goals - comment(increased confusion, re-evaluate goals next session)  Acute Rehab OT Goals Patient Stated Goal: none stated; agreeable to therapy  Plan Discharge plan needs to be updated;Frequency remains appropriate    Co-evaluation    PT/OT/SLP Co-Evaluation/Treatment: Yes Reason for Co-Treatment: Complexity of the patient's impairments (multi-system involvement);Necessary to address cognition/behavior during functional activity;For patient/therapist safety;To address functional/ADL transfers PT goals addressed during session: Mobility/safety with mobility;Balance OT goals addressed during session: ADL's and self-care      AM-PAC OT "6 Clicks" Daily Activity     Outcome Measure   Help from another person eating meals?: A Lot Help from another person taking care of personal grooming?: A Lot Help from another person toileting, which includes using toliet, bedpan, or urinal?: Total Help from another person bathing (including washing, rinsing, drying)?: A Lot Help from another person to put on and taking off regular upper body clothing?: A Lot Help from another person to put on and taking off regular lower body clothing?: Total 6 Click Score: 10    End of Session    OT Visit  Diagnosis: Muscle weakness (generalized) (M62.81);Other symptoms and signs involving cognitive function   Activity Tolerance Patient tolerated treatment well   Patient Left in bed;with call bell/phone within reach;with bed alarm set;with restraints reapplied   Nurse Communication Mobility status        Time: 8366-2947 OT Time Calculation (min): 32 min  Charges: OT General Charges $OT Visit: 1 Visit OT Treatments $Self Care/Home Management : 8-22 mins  Delight Stare, [place] Pager [phone] Office [phone]    Delight Stare 12/29/2018, 4:52 PM

## 2018-12-29 NOTE — Progress Notes (Signed)
Physical Therapy Treatment Patient Details Name: Holly Cooper MRN: [id number] DOB: [birth year] Today's Date: 12/29/2018    History of Present Illness Pt is a 85 y.o. 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 Also found to have RUE DVT.    PT Comments    Patient seen for mobility progression. Pt agreeable to therapy. Pt presents with impaired cognition/balance and decreased activity tolerance. It is difficult to fully assess cognition due to impaired communication. This session focused on bed mobility and sitting balance EOB. Pt requires assistance to maintain sitting EOB due to heavy R lateral lean with fatigue. Unable to progress to functional transfer training this session. Continue to progress as tolerated with anticipated d/c to SNF for further skilled PT services.      Follow Up Recommendations  SNF     Equipment Recommendations  Other (comment)(TBD)    Recommendations for Other Services       Precautions / Restrictions Precautions Precautions: Fall Restrictions Weight Bearing Restrictions: No    Mobility  Bed Mobility Overal bed mobility: Needs Assistance Bed Mobility: Rolling;Sidelying to Sit;Sit to Supine           General bed mobility comments: pt requires total A +2 for all aspects of bed mobility; initially pt assiting to bring bilat LE to EOB however than stopped assisting or following cues; pt grasping onto therapist and bed rails  Transfers                 General transfer comment: pt with heavy R lateral lean in sitting EOB and unable to progress to standing this session  Ambulation/Gait                 Stairs             Wheelchair Mobility    Modified Rankin (Stroke Patients Only)       Balance Overall balance assessment: Needs assistance Sitting-balance support: Feet supported;Single extremity supported;Bilateral  upper extremity supported Sitting balance-Leahy Scale: Poor Sitting balance - Comments: initially pt maintained sitting balance EOB with min guard/min A but with fatigue pt demonstrates heavy R lateral lean and requires assistance to maintain sitting balance with multimodal cues                                    Cognition Arousal/Alertness: Awake/alert Behavior During Therapy: WFL for tasks assessed/performed;Impulsive Overall Cognitive Status: Impaired/Different from baseline Area of Impairment: Orientation;Attention;Memory;Following commands;Safety/judgement;Awareness                 Orientation Level: Disoriented to;Place;Time;Situation Current Attention Level: Focused Memory: Decreased short-term memory Following Commands: Follows one step commands inconsistently;Follows one step commands with increased time Safety/Judgement: Decreased awareness of deficits Awareness: Intellectual   General Comments: Increased confusion, with incomprehensible speech. Following simple commands < 25% of the time       Exercises      General Comments        Pertinent Vitals/Pain Pain Assessment: Faces Faces Pain Scale: Hurts little more Pain Location: unable to specify; grimacing with mobility Pain Descriptors / Indicators: Grimacing Pain Intervention(s): Limited activity within patient's tolerance;Monitored during session;Repositioned    Home Living                      Prior Function  PT Goals (current goals can now be found in the care plan section) Acute Rehab PT Goals Patient Stated Goal: none stated; agreeable to therapy Progress towards PT goals: Not progressing toward goals - comment    Frequency    Min 2X/week      PT Plan Current plan remains appropriate    Co-evaluation PT/OT/SLP Co-Evaluation/Treatment: Yes Reason for Co-Treatment: Complexity of the patient's impairments (multi-system involvement);Necessary to address  cognition/behavior during functional activity;For patient/therapist safety;To address functional/ADL transfers PT goals addressed during session: Mobility/safety with mobility;Balance OT goals addressed during session: ADL's and self-care      AM-PAC PT "6 Clicks" Mobility   Outcome Measure  Help needed turning from your back to your side while in a flat bed without using bedrails?: Total Help needed moving from lying on your back to sitting on the side of a flat bed without using bedrails?: Total Help needed moving to and from a bed to a chair (including a wheelchair)?: Total Help needed standing up from a chair using your arms (e.g., wheelchair or bedside chair)?: Total Help needed to walk in hospital room?: Total Help needed climbing 3-5 steps with a railing? : Total 6 Click Score: 6    End of Session Equipment Utilized During Treatment: Oxygen Activity Tolerance: Patient limited by fatigue Patient left: with call bell/phone within reach;in bed;with bed alarm set Nurse Communication: Mobility status PT Visit Diagnosis: Unsteadiness on feet (R26.81);Other abnormalities of gait and mobility (R26.89);Muscle weakness (generalized) (M62.81);Difficulty in walking, not elsewhere classified (R26.2)     Time: 1524-1600 PT Time Calculation (min) (ACUTE ONLY): 36 min  Charges:  $Therapeutic Activity: 8-22 mins                     Earney Navy, PTA Acute Rehabilitation Services Pager: [phone] Office: [phone]     Darliss Cheney 12/29/2018, 4:35 PM

## 2018-12-29 NOTE — Progress Notes (Signed)
Inpatient Rehabilitation-Admissions Coordinator   [facility] met with pt at the bedside after PT/OT session. Per therapies, recommendation are for SNF currently. AC did speak with pt's daughter Jacqlyn Larsen to discuss PM&R consult note. AC explained that pt would only be considered for CIR if she had 24/7 A at DC as she will need substantial assistance as well as if she could tolerate 3 hours/day. AC did explain that at this time, the pt would not be able to tolerate CIR but AC would follow along for one more session to see if pt is able to progress.   Will follow up with pt and her daughter tomorrow.   Jhonnie Garner, OTR/L  Rehab Admissions Coordinator  [phone] 12/29/2018 4:27 PM

## 2018-12-29 NOTE — TOC Initial Note (Signed)
Transition of Care [facility]) - Initial/Assessment Note    Patient Details  Name: Holly Cooper MRN: [id number] Date of Birth: 1934/07/21  Transition of [facility] CM/SW Contact:    Gelene Mink, [place] Phone Number: 12/29/2018, 12:34 PM  Clinical Narrative:   CSW called and spoke to the patients daughter Pamala Hurry. CSW introduced herself and explained her role. CSW explained that her mother and been evaluated by PT and was being recommended for SNF. She stated that her mother had been to [facility] in [place]. Patient's daughter asked if she could call her sister who lived closer, she also helps care for her mother more. Her sister's name is Maxcine Ham and her phone number is [phone].   CSW called patient's daughter, Maxcine Ham. CSW explained that her mother was being recommended for SNF. She is in agreement with SNF. She stated that her first choice is [facility] in [place]. CSW asked if she could fax the patient out to other facilities near [place] and [place] if [facility] did not have a bed available. CSW explained about insurance. Ms. Joesph July provided her email so that the [facility] could email her a copy of the CMS SNF list. She had no other questions or concerns.                 Expected Discharge Plan: Skilled Nursing Facility Barriers to Discharge: Continued Medical Work up, Ship broker   Patient Goals and CMS Choice Patient states their goals for this hospitalization and ongoing recovery are:: Pt daughter wants her mother to go to SNF to get rehab CMS Medicare.gov Compare Post Acute Care list provided to:: Patient Represenative (must comment)(Pt daughter) Choice offered to / list presented to : Adult Children  Expected Discharge Plan and Services Expected Discharge Plan: [place] In-house Referral: NA Discharge Planning Services: NA Post Acute Care Choice: NA Living arrangements for the past 2 months: Single Family Home             DME Arranged: N/A DME Agency: NA HH Arranged: NA    Prior Living Arrangements/Services Living arrangements for the past 2 months: Single Family Home Lives with:: Self Patient language and need for interpreter reviewed:: No Do you feel safe going back to the place where you live?: Yes      Need for Family Participation in Patient Care: Yes (Comment)(Pt orient x1)     Criminal Activity/Legal Involvement Pertinent to Current Situation/Hospitalization: No - Comment as needed  Activities of Daily Living Home Assistive Devices/Equipment: None ADL Screening (condition at time of admission) Patient's cognitive ability adequate to safely complete daily activities?: No Is the patient deaf or have difficulty hearing?: No Does the patient have difficulty seeing, even when wearing glasses/contacts?: No Does the patient have difficulty concentrating, remembering, or making decisions?: Yes Patient able to express need for assistance with ADLs?: Yes Does the patient have difficulty dressing or bathing?: No Independently performs ADLs?: Yes (appropriate for developmental age) Does the patient have difficulty walking or climbing stairs?: Yes Weakness of Legs: Both Weakness of Arms/Hands: Both  Permission Sought/Granted Permission sought to share information with : Case Manager Permission granted to share information with : Yes, Verbal Permission Granted     Permission granted to share info w AGENCY: All SNF        Emotional Assessment Appearance:: Appears stated age Attitude/Demeanor/Rapport: Unable to Assess Affect (typically observed): Unable to Assess Orientation: : Oriented to Self Alcohol / Substance Use: Never Used Psych Involvement: No (comment)  Admission diagnosis:  Status Epilepticus  Patient Active Problem List   Diagnosis Date Noted  . Goals of care, counseling/discussion   . Palliative care by specialist   . Acute respiratory failure with hypoxia ([place])   .  Encephalitis   . Seizures ([place]) 12/12/2018   PCP:  Patient, No Pcp Per Pharmacy:   [facility] [address], [place] [address] Phone: [phone] Fax: [phone]     Social Determinants of Health (SDOH) Interventions    Readmission Risk Interventions No flowsheet data found.

## 2018-12-29 NOTE — Care Management Important Message (Signed)
Important Message  Patient Details  Name: Holly Cooper MRN: [id number] Date of Birth: [birth year]   Medicare Important Message Given:  Yes    A[place] Rody [address] 12/29/2018, 4:04 PM

## 2018-12-29 NOTE — [place] FL2 (Signed)
[place] MEDICAID FL2 LEVEL OF CARE SCREENING TOOL     IDENTIFICATION  Patient Name: Holly Cooper Birthdate: 1934-02-06 Sex: female Admission Date (Current Location): 12/12/2018  [facility] and [place] Number:  Herbalist and Address:  [facility], [place] [address] [place], [place] [address]      Provider Number: [id number]  Attending Physician Name and Address:  Shelly Coss, MD  Relative Name and Phone Number:  Trevor Mace, [phone]; Maxcine Ham, Daughter, [phone]; Delbert Phenix, Daughter, [phone]    Current Level of Care: Hospital Recommended Level of Care: [place] Prior Approval Number:    Date Approved/Denied: 09/15/18 PASRR Number: [id number]  Discharge Plan:      Current Diagnoses: Patient Active Problem List   Diagnosis Date Noted  . Goals of care, counseling/discussion   . Palliative care by specialist   . Acute respiratory failure with hypoxia ([place])   . Encephalitis   . Seizures ([place]) 12/12/2018    Orientation RESPIRATION BLADDER Height & Weight     Self  Normal Incontinent Weight: 190 lb 11.2 oz (86.5 kg) Height:  5\' 6"  (167.6 cm)(from 11/14/18 encounter)  BEHAVIORAL SYMPTOMS/MOOD NEUROLOGICAL BOWEL NUTRITION STATUS      Incontinent Diet(Dys 1 diet, nectar thick, crush medications with food)  AMBULATORY STATUS COMMUNICATION OF NEEDS Skin   Limited Assist Verbally Other (Comment), Bruising(MASD on perineum; brusing on arm)                       Personal Care Assistance Level of Assistance  Bathing, Feeding, Dressing, Total care Bathing Assistance: Limited assistance Feeding assistance: Limited assistance Dressing Assistance: Limited assistance Total Care Assistance: Limited assistance   Functional Limitations Info  Sight, Hearing Sight Info: Adequate Hearing Info: Adequate Speech Info: Adequate    SPECIAL CARE FACTORS FREQUENCY  PT (By licensed PT), OT (By licensed  OT), Speech therapy     PT Frequency: 5x/wk OT Frequency: 5x/wk     Speech Therapy Frequency: 3x/wk      Contractures Contractures Info: Not present    Additional Factors Info  Code Status, Allergies Code Status Info: Full Code Allergies Info: Atorvastatin           Current Medications (12/29/2018):  This is the current hospital active medication list Current Facility-Administered Medications  Medication Dose Route Frequency Provider Last Rate Last Dose  . 0.9 %  sodium chloride infusion   Intravenous PRN Collene Gobble, MD 10 mL/hr at 12/27/18 1900    . acetaminophen (TYLENOL) solution 650 mg  650 mg Per Tube Q6H PRN Collene Gobble, MD   650 mg at 12/28/18 0450  . albuterol (PROVENTIL) (2.5 MG/3ML) 0.083% nebulizer solution 2.5 mg  2.5 mg Nebulization Q6H PRN Collene Gobble, MD      . apixaban (ELIQUIS) tablet 5 mg  5 mg Oral BID Shelly Coss, MD      . bisacodyl (DULCOLAX) suppository 10 mg  10 mg Rectal Daily PRN Collene Gobble, MD      . chlorhexidine (PERIDEX) 0.12 % solution 15 mL  15 mL Mouth Rinse BID Collene Gobble, MD   15 mL at 12/29/18 1132  . Chlorhexidine Gluconate Cloth 2 % PADS 6 each  6 each Topical Daily Collene Gobble, MD   6 each at 12/28/18 2300  . labetalol (NORMODYNE,TRANDATE) injection 10 mg  10 mg Intravenous Q2H PRN Collene Gobble, MD   10 mg at 12/25/18  1819  . lacosamide (VIMPAT) tablet 200 mg  200 mg Oral BID Shelly Coss, MD      . lactated ringers infusion   Intravenous Continuous Collene Gobble, MD 10 mL/hr at 12/18/18 1900    . levETIRAcetam (KEPPRA) tablet 1,000 mg  1,000 mg Oral BID Shelly Coss, MD      . MEDLINE mouth rinse  15 mL Mouth Rinse q12n4p Collene Gobble, MD   15 mL at 12/29/18 1133  . pantoprazole (PROTONIX) EC tablet 40 mg  40 mg Oral Daily Adhikari, Amrit, MD      . phenytoin (DILANTIN) ER capsule 100 mg  100 mg Oral TID Shelly Coss, MD      . sodium chloride flush (NS) 0.9 % injection 10-40 mL  10-40 mL  Intracatheter PRN Collene Gobble, MD   10 mL at 12/20/18 0907     Discharge Medications: Please see discharge summary for a list of discharge medications.  Relevant Imaging Results:  Relevant Lab Results:   Additional Information SSN: [id number]  Philippa Chester Berk Pilot, LCSWA

## 2018-12-29 NOTE — Care Management Important Message (Signed)
Important Message  Patient Details  Name: Holly Cooper MRN: [id number] Date of Birth: [birth year]   Medicare Important Message Given:  Yes Patient was not able to sign due to illness.  Unsigned copy left   Senai Ramnath 12/29/2018, 4:05 PM

## 2018-12-30 ENCOUNTER — Encounter (HOSPITAL_COMMUNITY): Payer: Self-pay | Admitting: *Deleted

## 2018-12-30 LAB — CBC
HCT: 31.8 % — ABNORMAL LOW (ref 36.0–46.0)
Hemoglobin: 10.1 g/dL — ABNORMAL LOW (ref 12.0–15.0)
MCH: 29.6 pg (ref 26.0–34.0)
MCHC: 31.8 g/dL (ref 30.0–36.0)
MCV: 93.3 fL (ref 80.0–100.0)
Platelets: 440 10*3/uL — ABNORMAL HIGH (ref 150–400)
RBC: 3.41 MIL/uL — ABNORMAL LOW (ref 3.87–5.11)
RDW: 15.9 % — ABNORMAL HIGH (ref 11.5–15.5)
WBC: 8.7 10*3/uL (ref 4.0–10.5)
nRBC: 0 % (ref 0.0–0.2)

## 2018-12-30 LAB — BASIC METABOLIC PANEL
Anion gap: 9 (ref 5–15)
BUN: 20 mg/dL (ref 8–23)
CO2: 24 mmol/L (ref 22–32)
Calcium: 9.3 mg/dL (ref 8.9–10.3)
Chloride: 106 mmol/L (ref 98–111)
Creatinine, Ser: 0.79 mg/dL (ref 0.44–1.00)
GFR calc Af Amer: >60 mL/min (ref >60)
GFR calc non Af Amer: >60 mL/min (ref >60)
Glucose, Bld: 94 mg/dL (ref 70–99)
Potassium: 4.1 mmol/L (ref 3.5–5.1)
Sodium: 139 mmol/L (ref 135–145)

## 2018-12-30 LAB — GLUCOSE, CAPILLARY
Glucose-Capillary: 71 mg/dL (ref 70–99)
Glucose-Capillary: 88 mg/dL (ref 70–99)
Glucose-Capillary: 90 mg/dL (ref 70–99)
Glucose-Capillary: 97 mg/dL (ref 70–99)

## 2018-12-30 MED ORDER — EDOXABAN TOSYLATE 30 MG PO TABS
60.0000 mg | ORAL_TABLET | ORAL | Status: DC
  Administered 2018-12-30: 60 mg via ORAL
  Filled 2018-12-30: qty 2
  Filled 2018-12-30: qty 60

## 2018-12-30 MED ORDER — PHENYTOIN SODIUM EXTENDED 100 MG PO CAPS: 100.0000 mg | ORAL_CAPSULE | Freq: Three times a day (TID) | ORAL | Status: AC

## 2018-12-30 MED ORDER — PANTOPRAZOLE SODIUM 40 MG PO TBEC: 40.0000 mg | DELAYED_RELEASE_TABLET | Freq: Every day | ORAL | Status: AC

## 2018-12-30 MED ORDER — LACOSAMIDE 200 MG PO TABS: 200.0000 mg | ORAL_TABLET | Freq: Two times a day (BID) | ORAL | Status: AC

## 2018-12-30 MED ORDER — EDOXABAN TOSYLATE 60 MG PO TABS: 60.0000 mg | ORAL_TABLET | ORAL | 0 refills | Status: DC

## 2018-12-30 MED ORDER — EDOXABAN TOSYLATE 60 MG PO TABS: 60.0000 mg | ORAL_TABLET | ORAL | Status: DC

## 2018-12-30 MED ORDER — APIXABAN 5 MG PO TABS: 5.0000 mg | ORAL_TABLET | Freq: Two times a day (BID) | ORAL | 0 refills | Status: AC

## 2018-12-30 MED ORDER — LEVETIRACETAM 1000 MG PO TABS: 1000.0000 mg | ORAL_TABLET | Freq: Two times a day (BID) | ORAL | Status: AC

## 2018-12-30 MED FILL — ELIQUIS 5 MG TABLET: 5 | 30 days supply | Qty: 60 | Fill #0

## 2018-12-30 NOTE — Progress Notes (Signed)
Gave report to RN at receiving facility. Awaiting for PTAR.

## 2018-12-30 NOTE — Progress Notes (Signed)
Inpatient Rehabilitation-Admissions Coordinator   [facility] followed up with pt and her family this AM. Noted recommendations for SNF. Feel pt would need a longer term, slower-paced rehab and would not be able to tolerate 3 hours of therapy/day currently. AC communicated this with pt's daughter, Jacqlyn Larsen who verbalized understanding. AC communicated need for SNF placement to SW.   [facility] will sign off.   Jhonnie Garner, OTR/L  Rehab Admissions Coordinator  [phone] 12/30/2018 10:34 AM

## 2018-12-30 NOTE — Discharge Summary (Addendum)
Physician Discharge Summary  Holly Cooper LFY:[id number] DOB: 06/16/1934 DOA: 12/12/2018  PCP: Patient, No Pcp Per  Admit date: 12/12/2018 Discharge date: 12/30/2018  Admitted From: Home Disposition:  Home  Discharge Condition:Stable CODE STATUS:FULL, Diet recommendation: Dysphagia 1  Brief/Interim Summary:  Patient is 85 year old female with past medical history of CVA, history of DVT/PE on Eliquis with questionable compliance who presented to the emergency department with recurrent seizures.  She was admitted with status epilepticus.  She was intubated for failure to protect her airway.  She was initially admitted under PCCM service.  She has been extubated and handed over to hospitalist team on 12/27/2018.  Since he has been unable to tolerate anything by mouth,so  she was started on tube feeding.She has been cleared by speech to start on dysphagia 1 diet today.  Physical therapy/Occupational Therapy have recommended her CIR on discharge but the recommendation has been changed to a skilled nursing facility now.  She is stable for discharge today.  She will follow-up with neurology as an outpatient.  Following problems were addressed during her hospitalization:  Acute encephalopathy: Mental status has  improved .She is alert and oriented to person and place.Obeys commands. Patient was treated empirically for HSV encephalitis.  Completed course of  acyclovir. Neurology was  following .  LP was not possible after attempted but IR.  Status epilepticus: Presented with recurrent seizures.  Neurology was following.  Currently on oral Vimpat, Keppra and Dilantin.  No history of seizures in the past.  Neurology thought that her seizures are most likely associated with  her old stroke.  Neurology recommended to continue these antiseizure medications indefinitely and follow-up as an outpatient.  Acute respiratory failure: Required intubation to protect airway.  Finally extubated.  SLP following.   Respiratory status currently stable.  History of stroke: She has history of old strokes.  MRI of the brain showed atrophy and chronic infarctions.  Neurology was following.  Inability to take orally/dysphagia: Speech therapy was following.  She was initially started on tube feeding. Started on dysphagia 1 diet .  History of DVT/PE/new acute DVT: Was on warfarin at home but there is question on complaine.  She has history of DVT and PE.  Doppler study done here showed acute DVT in the right axillary vein and right brachial vein.  Started on eliquis.  Hypertension: Currently blood pressure soft. No need for antihypertensive for now.  Normocytic anemia: Continue to monitor H&H.  Debility/deconditioning: Physical therapy and occupational therapy evaluated the patient recommended CIR on discharge.Now the recommendation is nursing facility.  Poor oral intake/multiple comorbidities: Palliative care was following and discussed with family about goals of care.  Discharge Diagnoses:  Active Problems:   Seizures ([place])   Acute respiratory failure with hypoxia ([facility])   Encephalitis   Goals of care, counseling/discussion   Palliative care by specialist    Discharge Instructions  Discharge Instructions    Ambulatory referral to Neurology   Complete by:  As directed    An appointment is requested in approximately: 4 weeks   Diet general   Complete by:  As directed    Dysphagia 1 diet   Discharge instructions   Complete by:  As directed    1)Please take prescribed medications as instructed. 2)Follow up with neurology in 4 weeks.  Name and number of the provider group has been attached.   Increase activity slowly   Complete by:  As directed      Allergies as of 12/30/2018  Reactions   Atorvastatin Other (See Comments)   Muscle aches - back       Medication List    STOP taking these medications   lisinopril-hydrochlorothiazide 20-25 MG tablet Commonly known as:   PRINZIDE,ZESTORETIC   warfarin 1 MG tablet Commonly known as:  COUMADIN   warfarin 2 MG tablet Commonly known as:  COUMADIN     TAKE these medications   apixaban 5 MG Tabs tablet Commonly known as:  Eliquis Take 1 tablet (5 mg total) by mouth 2 (two) times daily.   lacosamide 200 MG Tabs tablet Commonly known as:  VIMPAT Take 1 tablet (200 mg total) by mouth 2 (two) times daily.   levETIRAcetam 1000 MG tablet Commonly known as:  KEPPRA Take 1 tablet (1,000 mg total) by mouth 2 (two) times daily.   ondansetron 8 MG tablet Commonly known as:  ZOFRAN Take 8 mg by mouth every 8 (eight) hours as needed for nausea or vomiting.   pantoprazole 40 MG tablet Commonly known as:  PROTONIX Take 1 tablet (40 mg total) by mouth daily. Start taking on:  December 31, 2018   phenytoin 100 MG ER capsule Commonly known as:  DILANTIN Take 1 capsule (100 mg total) by mouth 3 (three) times daily.       Contact information for follow-up providers    [facility]. Schedule an appointment as soon as possible for a visit in 4 week(s).   Specialty:  Neurology Contact information: [address] [place] [phone]           Contact information for after-discharge care    [facility] .   Service:  Skilled Nursing Contact information: [place] [phone]                 Allergies  Allergen Reactions  . Atorvastatin Other (See Comments)    Muscle aches - back     Consultations:  Neurology, palliative care   Procedures/Studies: Dg Abd 1 View  Result Date: 12/25/2018 CLINICAL DATA:  Nasogastric tube placement EXAM: ABDOMEN - 1 VIEW COMPARISON:  November 14, 2018 FINDINGS: Nasogastric tube tip is in the proximal stomach with the side port at the gastroesophageal junction. There is no bowel dilatation or air-fluid level to suggest  bowel obstruction. No free air. There is atelectatic change in the left base. There is degenerative change in the lumbar spine. IMPRESSION: Nasogastric tube tip in proximal stomach with side port at gastroesophageal junction. Advise advancing nasogastric tube 5-6 cm. No bowel obstruction or free air evident.  Left base atelectasis. Electronically Signed   By: Lowella Grip III M.D.   On: 12/25/2018 20:23   Mr Brain Wo Contrast  Result Date: 12/17/2018 CLINICAL DATA:  Encephalopathy. New onset seizures. Initial presentation of status epilepticus, now resolved. Intubated due to respiratory failure. EXAM: MRI HEAD WITHOUT CONTRAST TECHNIQUE: Multiplanar, multiecho pulse sequences of the brain and surrounding structures were obtained without intravenous contrast. COMPARISON:  Head CT 12/12/2018 FINDINGS: Multiple sequences are moderately motion degraded. The degree of movement precluded performing thin-section seizure protocol temporal lobe imaging. Brain: There is no evidence of acute infarct, intracranial hemorrhage, mass, midline shift, or extra-axial fluid collection. There is moderate cerebral atrophy. Multiple small chronic infarcts are present in both cerebellar hemispheres as well as in the right thalamus. Cerebral white matter T2 hyperintensities are nonspecific but compatible with mild chronic small vessel ischemic  disease. Vascular: Major intracranial vascular flow voids are preserved. Skull and upper cervical spine: Unremarkable bone marrow signal. Sinuses/Orbits: Bilateral cataract extraction. Mild scattered mucosal thickening in the paranasal sinuses. Small mastoid effusions, right larger than left. Other: None. IMPRESSION: 1. Motion degraded examination. 2. No acute intracranial abnormality. 3. Chronic small vessel ischemic disease with chronic thalamic and cerebellar infarcts. Electronically Signed   By: Logan Bores M.D.   On: 12/17/2018 16:33   Dg Chest Port 1 View  Result Date:  12/24/2018 CLINICAL DATA:  85 year old female with a history of respiratory failure EXAM: PORTABLE CHEST 1 VIEW COMPARISON:  12/23/2010 FINDINGS: Cardiomediastinal silhouette unchanged in size and contour. Endotracheal tube has been slightly withdrawn, terminating 3.6 cm above the carina. Unchanged left upper extremity PICC. Unchanged gastric tube terminating out of the field of view. Low lung volumes with opacities partially obscuring the bilateral hemidiaphragm with meniscus on the left. No pneumothorax. Interlobular septal thickening with thickening of the minor fissure. Reticular opacities. IMPRESSION: Persisting edema and small bilateral pleural effusions with associated atelectasis/consolidation. Endotracheal tube terminates suitably above the carina. Unchanged gastric tube and left upper extremity PICC Electronically Signed   By: Corrie Mckusick D.O.   On: 12/24/2018 08:14   Dg Chest Port 1 View  Result Date: 12/23/2018 CLINICAL DATA:  85 year old female with a history of respiratory failure. EXAM: PORTABLE CHEST 1 VIEW COMPARISON:  Multiple prior most recent 12/22/2018 FINDINGS: Cardiomediastinal silhouette unchanged in size and contour, partially obscured by overlying lung/pleural disease and the low lung volumes. Blunting at the left costophrenic angle with opacity in the retrocardiac region. Blunting of the right costophrenic angle. Endotracheal tube terminates above the carina approximately 2.6 cm. Left upper extremity PICC appears to terminate superior vena cava. Gastric tube terminates out of the field of view. Low lung volumes with coarsened interstitial markings. Aeration is slightly improved from the prior. No pneumothorax. IMPRESSION: Low lung volumes persist with small basilar pleural effusion and associated atelectasis/consolidation. Slight improved aeration of the lungs compared to prior. Unchanged endotracheal tube, gastric tube, and left upper extremity PICC Electronically Signed   By: Corrie Mckusick D.O.   On: 12/23/2018 09:29   Dg Chest Port 1 View  Result Date: 12/22/2018 CLINICAL DATA:  Endotracheal tube. Seizures. History of stroke. Respiratory failure. EXAM: PORTABLE CHEST 1 VIEW COMPARISON:  12/20/2018 FINDINGS: Endotracheal tube tip is 2.5 cm above the carina. Left PICC line tip: Cavoatrial junction. Very low lung volumes. Borderline enlargement of the cardiopericardial silhouette. Atherosclerotic calcification of the aortic arch. Stable bilateral interstitial accentuation. Stable densities along the lung bases which could be from airspace opacity or small layering pleural effusions. IMPRESSION: 1. Stable somewhat coarse peripheral interstitial accentuation, nonspecific although fibrosis could give this appearance. 2. Stable basilar opacities, possibly from small pleural effusions or consolidation. 3. Low lung volumes. Electronically Signed   By: Van Clines M.D.   On: 12/22/2018 07:42   Dg Chest Port 1 View  Result Date: 12/20/2018 CLINICAL DATA:  Followup exam.  Pulmonary edema. EXAM: PORTABLE CHEST 1 VIEW COMPARISON:  12/16/2018 and older exams. FINDINGS: Mild interval increase in lung base opacities bilaterally consistent with atelectasis, likely with small effusions. No other change. There is persistent bilateral interstitial thickening predominating peripherally. No pneumothorax. Left PICC tip now projects in the central left brachiocephalic vein, not clearly entering the superior vena cava, which appears slightly retracted from the previous day's exam. Endotracheal nasal/orogastric tube are stable in well positioned. IMPRESSION: 1. Mild increase in lung base opacity  consistent with combination of small effusions and atelectasis. 2. No other change in the appearance of the lungs, with chronic bilateral interstitial thickening consistent with fibrosis. 3. Left PICC appears slightly retracted compared to the previous day's exam as detailed above. No other change. Electronically  Signed   By: Lajean Manes M.D.   On: 12/20/2018 08:06   Dg Chest Port 1 View  Result Date: 12/16/2018 CLINICAL DATA:  Intubated, stroke EXAM: PORTABLE CHEST 1 VIEW COMPARISON:  12/12/2018 FINDINGS: Interval placement of left central line with the tip in the upper SVC. No pneumothorax. Endotracheal tube and NG tube are unchanged. Heart is borderline in size. Left basilar airspace opacity, similar prior study. Low lung volumes. No effusion or acute bony abnormality. IMPRESSION: Interval placement of left central line with the tip in the upper SVC. No pneumothorax. Low lung volumes. Left lower lobe atelectasis or infiltrate. Electronically Signed   By: Rolm Baptise M.D.   On: 12/16/2018 07:32   Dg Chest Port 1 View  Result Date: 12/12/2018 CLINICAL DATA:  Found unresponsive.  Intubated. EXAM: PORTABLE CHEST 1 VIEW COMPARISON:  Earlier same day FINDINGS: Endotracheal tube tip is 1 cm above the carina. Orogastric tube enters the abdomen. Chronic appearing lung markings skin seen, with left base atelectasis and or infiltrate. Slight improved aeration on the left compared to the previous study. IMPRESSION: Endotracheal tube tip 1 cm above the carina. Slightly improved aeration at the left base where there is some atelectasis and or infiltrate. Electronically Signed   By: Nelson Chimes M.D.   On: 12/12/2018 18:00   Dg Abd 2 Views  Result Date: 12/27/2018 CLINICAL DATA:  NG tube placement EXAM: ABDOMEN - 2 VIEW COMPARISON:  12/26/2018 FINDINGS: NG tube tip is in the body of the stomach. The stomach is decompressed. There are no disproportionally dilated loops of bowel. There is no free intraperitoneal gas. Vascular calcifications are noted in the left upper quadrant and pelvis. Opacities at the left lung base are unchanged. IMPRESSION: Stable NG tube with its tip in the body of the stomach. Nonobstructive bowel gas pattern. Electronically Signed   By: Marybelle Killings M.D.   On: 12/27/2018 10:35   Dg Abd Portable  1v  Result Date: 12/26/2018 CLINICAL DATA:  Nasogastric advancement. EXAM: PORTABLE ABDOMEN - 1 VIEW COMPARISON:  12/25/2018 FINDINGS: Nasogastric tube tip is advanced slightly, with the tip in the mid body of the stomach. Side hole is probably at the gastroesophageal junction or just within the stomach. Abdominal gas pattern is unremarkable. IMPRESSION: Nasogastric tube advanced slightly with the tip in the mid body. Electronically Signed   By: Nelson Chimes M.D.   On: 12/26/2018 06:51   Vas [place] Lower Extremity Venous (dvt)  Result Date: 12/17/2018  Lower Venous Study Indications: Swelling, and Edema.  Performing Technologist: Abram Sander RVS  Examination Guidelines: A complete evaluation includes B-mode imaging, spectral Doppler, color Doppler, and power Doppler as needed of all accessible portions of each vessel. Bilateral testing is considered an integral part of a complete examination. Limited examinations for reoccurring indications may be performed as noted.  Right Venous Findings: +---------+---------------+---------+-----------+----------+-------+          CompressibilityPhasicitySpontaneityPropertiesSummary +---------+---------------+---------+-----------+----------+-------+ CFV      Full           Yes      Yes                          +---------+---------------+---------+-----------+----------+-------+ SFJ  Full                                                 +---------+---------------+---------+-----------+----------+-------+ FV Prox  Full                                                 +---------+---------------+---------+-----------+----------+-------+ FV Mid   Full                                                 +---------+---------------+---------+-----------+----------+-------+ FV DistalFull                                                 +---------+---------------+---------+-----------+----------+-------+ PFV      Full                                                  +---------+---------------+---------+-----------+----------+-------+ POP      Full           Yes      Yes                          +---------+---------------+---------+-----------+----------+-------+ PTV      Full                                                 +---------+---------------+---------+-----------+----------+-------+ PERO     Full                                                 +---------+---------------+---------+-----------+----------+-------+  Left Venous Findings: +---------+---------------+---------+-----------+----------+-------+          CompressibilityPhasicitySpontaneityPropertiesSummary +---------+---------------+---------+-----------+----------+-------+ CFV      Full           Yes      Yes                          +---------+---------------+---------+-----------+----------+-------+ SFJ      Full                                                 +---------+---------------+---------+-----------+----------+-------+ FV Prox  Full                                                 +---------+---------------+---------+-----------+----------+-------+  FV Mid   Full                                                 +---------+---------------+---------+-----------+----------+-------+ FV DistalFull                                                 +---------+---------------+---------+-----------+----------+-------+ PFV      Full                                                 +---------+---------------+---------+-----------+----------+-------+ POP      Full           Yes      Yes                          +---------+---------------+---------+-----------+----------+-------+ PTV      Full                                                 +---------+---------------+---------+-----------+----------+-------+ PERO     Full                                                  +---------+---------------+---------+-----------+----------+-------+    Summary: Right: There is no evidence of deep vein thrombosis in the lower extremity. No cystic structure found in the popliteal fossa. Left: There is no evidence of deep vein thrombosis in the lower extremity. No cystic structure found in the popliteal fossa.  *See table(s) above for measurements and observations. Electronically signed by Servando Snare MD on 12/17/2018 at 1:02:46 PM.    Final    Vas [place] Upper Extremity Venous Duplex  Result Date: 12/21/2018 UPPER VENOUS STUDY  Indications: Edema Performing Technologist: Toma Copier RVS  Examination Guidelines: A complete evaluation includes B-mode imaging, spectral Doppler, color Doppler, and power Doppler as needed of all accessible portions of each vessel. Bilateral testing is considered an integral part of a complete examination. Limited examinations for reoccurring indications may be performed as noted.  Right Findings: +----------+------------+---------+-----------+----------+---------------------+ RIGHT     CompressiblePhasicitySpontaneousProperties       Summary        +----------+------------+---------+-----------+----------+---------------------+ IJV           Full       Yes       Yes                                    +----------+------------+---------+-----------+----------+---------------------+ Subclavian    Full                                                        +----------+------------+---------+-----------+----------+---------------------+  Axillary    Partial      Yes       Yes                                    +----------+------------+---------+-----------+----------+---------------------+ Brachial    Partial      Yes       Yes                                    +----------+------------+---------+-----------+----------+---------------------+ Radial        Full                                                         +----------+------------+---------+-----------+----------+---------------------+ Ulnar         Full                                                        +----------+------------+---------+-----------+----------+---------------------+ Cephalic    Partial                                 Non compressible from                                                        the forearm to                                                          proximal upper arm                                                          where it became                                                            compressible      +----------+------------+---------+-----------+----------+---------------------+ Basilic     Partial                                  Compressible at the  wrist and became non                                                       compressible at the                                                       mid forearm to the                                                        confluence with the                                                            brachial        +----------+------------+---------+-----------+----------+---------------------+  Left Findings: +----------+------------+---------+-----------+----------+-------+ LEFT      CompressiblePhasicitySpontaneousPropertiesSummary +----------+------------+---------+-----------+----------+-------+ Subclavian    Full       Yes       Yes                      +----------+------------+---------+-----------+----------+-------+  Summary:  Right: No evidence of thrombosis in the subclavian. Findings consistent with acute deep vein thrombosis involving the right axillary vein and right brachial veins. Findings consistent with acute superficial vein thrombosis involving the right basilic vein and right cephalic vein.  Left: No  evidence of thrombosis in the subclavian. There is no evidence of a subclavian obstruction.  *See table(s) above for measurements and observations.  Diagnosing physician: Monica Martinez MD Electronically signed by Monica Martinez MD on 12/21/2018 at 9:06:17 AM.    Final    [place] Ekg Site Rite  Result Date: 12/20/2018 If Site Rite image not attached, placement could not be confirmed due to current cardiac rhythm.  [place] Ekg Site Rite  Result Date: 12/15/2018 If [facility] image not attached, placement could not be confirmed due to current cardiac rhythm.     Subjective:  Patient seen and examined the bedside this morning.  Comfortable.  Hemodynamically stable.  Stable for discharge today to skilled nursing facility.  Discharge Exam: Vitals:   12/30/18 0823 12/30/18 1101  BP: (!) 120/27 139/68  Pulse: 77 79  Resp: 15 18  Temp: 98.6 F (37 C) 98.8 F (37.1 C)  SpO2: 98% 98%   Vitals:   12/30/18 0006 12/30/18 0435 12/30/18 0823 12/30/18 1101  BP: 123/67 100/62 (!) 120/27 139/68  Pulse: 88 84 77 79  Resp: 20 18 15 18   Temp: 100 F (37.8 C) 99.1 F (37.3 C) 98.6 F (37 C) 98.8 F (37.1 C)  TempSrc: Axillary Oral Oral Oral  SpO2: 95% 96% 98% 98%  Weight:      Height:        General: Pt is alert, awake, not in acute distress Cardiovascular: RRR, S1/S2 +, no rubs, no gallops Respiratory:  CTA bilaterally, no wheezing, no rhonchi Abdominal: Soft, NT, ND, bowel sounds + Extremities: no edema, no cyanosis    The results of significant diagnostics from this hospitalization (including imaging, microbiology, ancillary and laboratory) are listed below for reference.     Microbiology: Recent Results (from the past 240 hour(s))  Culture, blood (routine x 2)     Status: None (Preliminary result)   Collection Time: 12/27/18  7:08 PM  Result Value Ref Range Status   Specimen Description BLOOD RIGHT FOOT  Final   Special Requests   Final    BOTTLES DRAWN AEROBIC AND ANAEROBIC Blood  Culture adequate volume   Culture   Final    NO GROWTH 3 DAYS Performed at [facility], [address] [place], [place] [address]    Report Status PENDING  Incomplete  Culture, blood (routine x 2)     Status: None (Preliminary result)   Collection Time: 12/27/18  7:13 PM  Result Value Ref Range Status   Specimen Description BLOOD LEFT FOOT  Final   Special Requests   Final    BOTTLES DRAWN AEROBIC ONLY Blood Culture adequate volume   Culture   Final    NO GROWTH 3 DAYS Performed at [facility], [address] [place], [place] [address]    Report Status PENDING  Incomplete     Labs: BNP (last 3 results) No results for input(s): BNP in the last 8760 hours. Basic Metabolic Panel: Recent Labs  Lab 12/24/18 0422 12/25/18 1040 12/25/18 1625 12/26/18 0505 12/26/18 1700 12/27/18 0624 12/30/18 0457  NA 135  --   --   --   --  137 139  K 4.4  --   --   --   --  3.8 4.1  CL 100  --   --   --   --  101 106  CO2 28  --   --   --   --  25 24  GLUCOSE 123*  --   --   --   --  83 94  BUN 26*  --   --   --   --  20 20  CREATININE 0.57  --   --   --   --  0.61 0.79  CALCIUM 8.9  --   --   --   --  9.1 9.3  MG 2.0 2.0 2.1 2.0 2.1  --   --   PHOS 3.7 4.4 3.8 3.3 3.3  --   --    Liver Function Tests: Recent Labs  Lab 12/27/18 0624  AST 59*  ALT 62*  ALKPHOS 84  BILITOT 0.4  PROT 5.5*  ALBUMIN 1.8*   No results for input(s): LIPASE, AMYLASE in the last 168 hours. No results for input(s): AMMONIA in the last 168 hours. CBC: Recent Labs  Lab 12/24/18 0422 12/27/18 0624 12/30/18 0457  WBC 5.9 7.3 8.7  HGB 9.4* 9.4* 10.1*  HCT 30.9* 30.4* 31.8*  MCV 96.3 93.5 93.3  PLT 317 382 440*   Cardiac Enzymes: No results for input(s): CKTOTAL, CKMB, CKMBINDEX, TROPONINI in the last 168 hours. BNP: Invalid input(s): POCBNP CBG: Recent Labs  Lab 12/29/18 2032 12/30/18 0003 12/30/18 0431 12/30/18 0735 12/30/18 1055  GLUCAP 126* 97 88 90 71   D-Dimer No  results for input(s): DDIMER in the last 72 hours. Hgb A1c No results for input(s): HGBA1C in the last 72 hours. Lipid Profile No results for input(s): CHOL, HDL, LDLCALC,  TRIG, CHOLHDL, LDLDIRECT in the last 72 hours. Thyroid function studies No results for input(s): TSH, T4TOTAL, T3FREE, THYROIDAB in the last 72 hours.  Invalid input(s): FREET3 Anemia work up No results for input(s): VITAMINB12, FOLATE, FERRITIN, TIBC, IRON, RETICCTPCT in the last 72 hours. Urinalysis    Component Value Date/Time   COLORURINE YELLOW 12/27/2018 2239   APPEARANCEUR HAZY (A) 12/27/2018 2239   LABSPEC 1.010 12/27/2018 2239   PHURINE 7.5 12/27/2018 2239   GLUCOSEU NEGATIVE 12/27/2018 2239   HGBUR TRACE (A) 12/27/2018 2239   BILIRUBINUR NEGATIVE 12/27/2018 2239   KETONESUR NEGATIVE 12/27/2018 2239   PROTEINUR NEGATIVE 12/27/2018 2239   NITRITE NEGATIVE 12/27/2018 2239   LEUKOCYTESUR TRACE (A) 12/27/2018 2239   Sepsis Labs Invalid input(s): PROCALCITONIN,  WBC,  LACTICIDVEN Microbiology Recent Results (from the past 240 hour(s))  Culture, blood (routine x 2)     Status: None (Preliminary result)   Collection Time: 12/27/18  7:08 PM  Result Value Ref Range Status   Specimen Description BLOOD RIGHT FOOT  Final   Special Requests   Final    BOTTLES DRAWN AEROBIC AND ANAEROBIC Blood Culture adequate volume   Culture   Final    NO GROWTH 3 DAYS Performed at [facility], [place] [address], [place], [place] [address]    Report Status PENDING  Incomplete  Culture, blood (routine x 2)     Status: None (Preliminary result)   Collection Time: 12/27/18  7:13 PM  Result Value Ref Range Status   Specimen Description BLOOD LEFT FOOT  Final   Special Requests   Final    BOTTLES DRAWN AEROBIC ONLY Blood Culture adequate volume   Culture   Final    NO GROWTH 3 DAYS Performed at [facility], [address] [place], [place] [address]    Report Status PENDING  Incomplete    Please note: You  were cared for by a hospitalist during your hospital stay. Once you are discharged, your primary care physician will handle any further medical issues. Please note that NO REFILLS for any discharge medications will be authorized once you are discharged, as it is imperative that you return to your primary care physician (or establish a relationship with a primary care physician if you do not have one) for your post hospital discharge needs so that they can reassess your need for medications and monitor your lab values.    Time coordinating discharge: 40 minutes  SIGNED:   Shelly Coss, MD  [facility] 12/30/2018, 11:59 AM Pager [phone]  If 7PM-7AM, please contact night-coverage [email or web address] Password [email or web address]

## 2018-12-30 NOTE — TOC Transition Note (Addendum)
Transition of Care [facility]) - CM/SW Discharge Note   Patient Details  Name: SERITA DEGROOTE MRN: [id number] Date of Birth: 05/28/1934  Transition of Care [facility]) CM/SW Contact:  Geralynn Ochs, LCSW Phone Number: 12/30/2018, 11:43 AM   Clinical Narrative:   Nurse to call report to [phone], Room 408; ask for [place]       Barriers to Discharge: No Barriers Identified   Patient Goals and CMS Choice Patient states their goals for this hospitalization and ongoing recovery are:: Pt daughter wants her mother to go to SNF to get rehab CMS Medicare.gov Compare Post Acute Care list provided to:: Patient Represenative (must comment)(Pt daughter) Choice offered to / list presented to : Adult Children  Discharge Placement              Patient [facility] bed at: [facility] Patient to be transferred to facility by: Alexander Name of family member notified: Becky Patient and family notified of of transfer: 12/30/18  Discharge Plan and Services In-house Referral: NA Discharge Planning Services: NA Post Acute Care Choice: NA          DME Arranged: N/A DME Agency: NA HH Arranged: NA     Social Determinants of Health (SDOH) Interventions     Readmission Risk Interventions No flowsheet data found.

## 2019-01-01 LAB — CULTURE, BLOOD (ROUTINE X 2)
Culture: NO GROWTH
Culture: NO GROWTH
Special Requests: ADEQUATE
Special Requests: ADEQUATE

## 2019-02-16 DEATH — deceased

## 2020-01-30 IMAGING — DX PORTABLE CHEST - 1 VIEW
1 series · 1 of 1 positions shown · non-contrast
Comparison: 12/16/2018 and older exams.

CLINICAL DATA: Followup exam.  Pulmonary edema.

EXAM:
PORTABLE CHEST 1 VIEW

[chest]
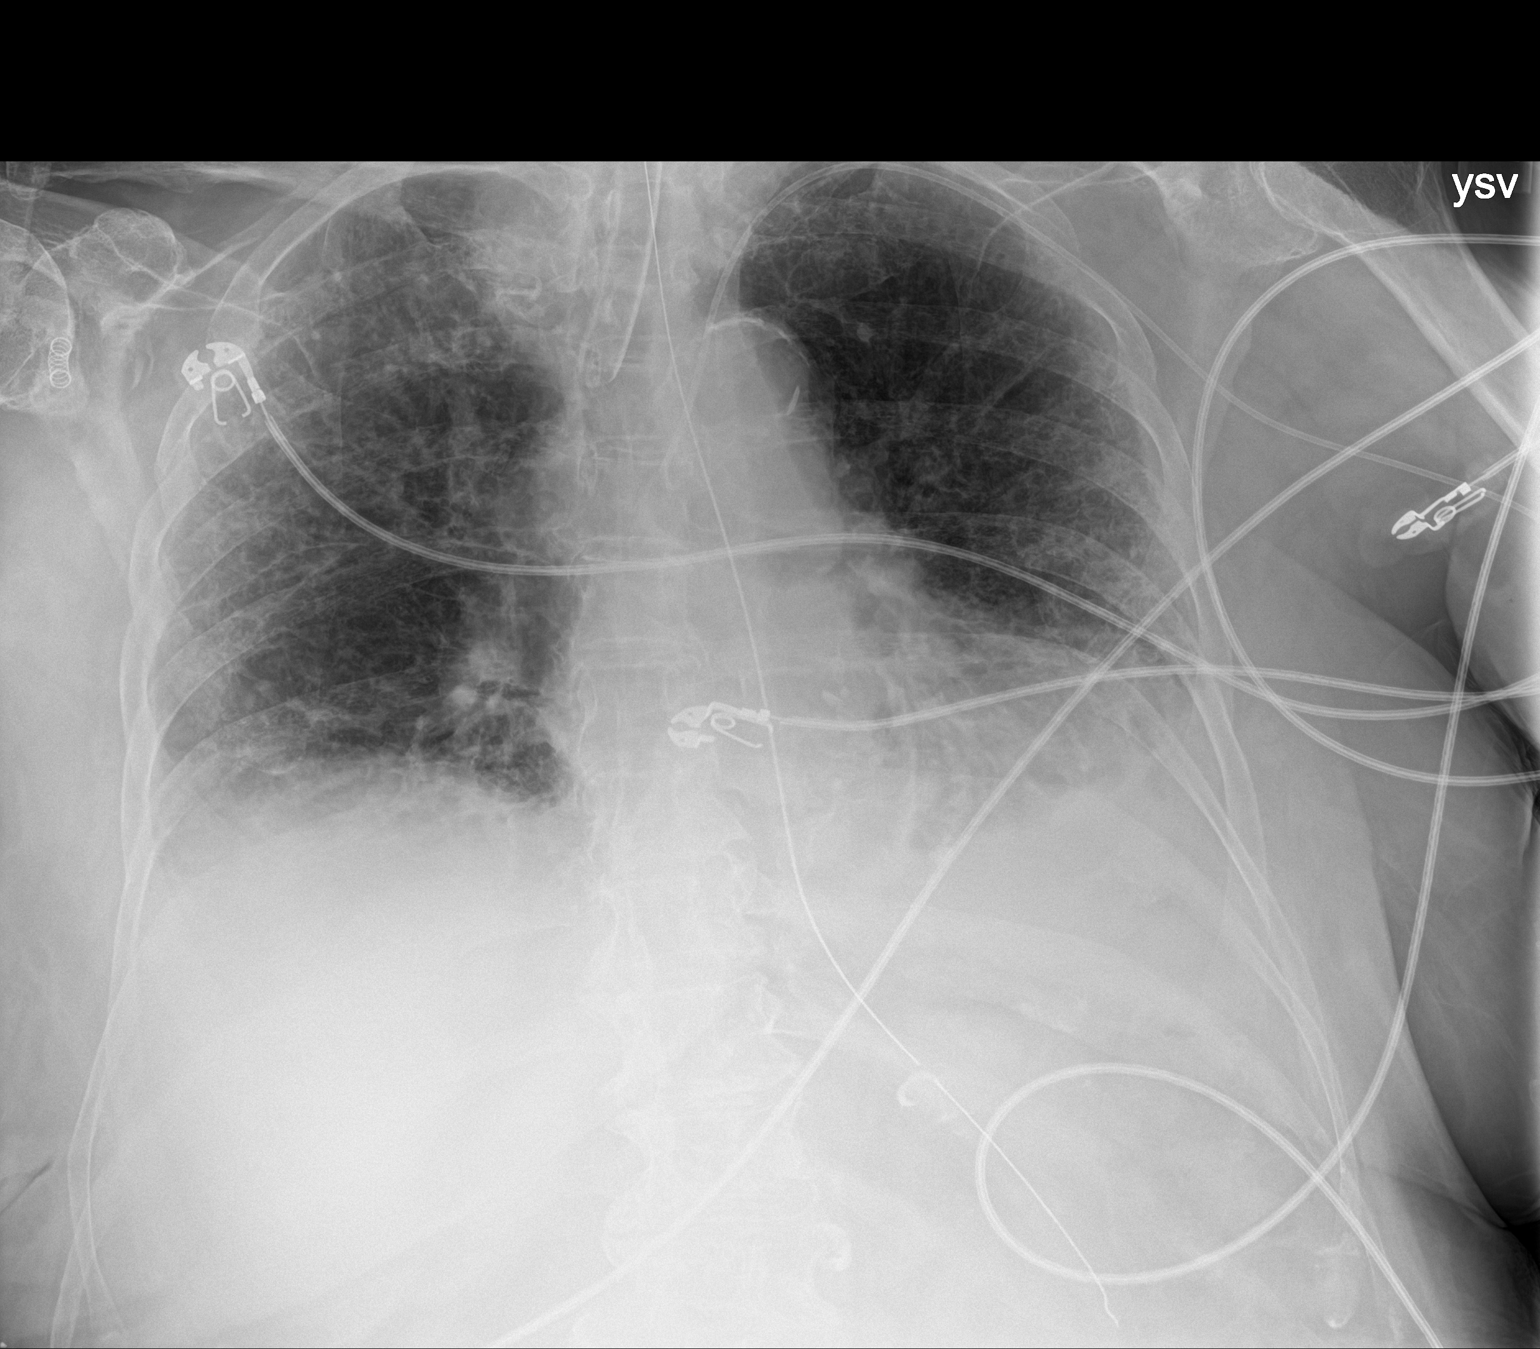

[1 of 1 positions shown; findings below may reference images not displayed]

FINDINGS: Mild interval increase in lung base opacities bilaterally consistent
with atelectasis, likely with small effusions.

No other change. There is persistent bilateral interstitial
thickening predominating peripherally.

No pneumothorax.

Left PICC tip now projects in the central left brachiocephalic vein,
not clearly entering the superior vena cava, which appears slightly
retracted from the previous day's exam.

Endotracheal nasal/orogastric tube are stable in well positioned.
IMPRESSION: 1. Mild increase in lung base opacity consistent with combination of
small effusions and atelectasis.
2. No other change in the appearance of the lungs, with chronic
bilateral interstitial thickening consistent with fibrosis.
3. Left PICC appears slightly retracted compared to the previous
day's exam as detailed above. No other change.

## 2020-02-02 IMAGING — DX PORTABLE CHEST - 1 VIEW
1 series · 1 of 1 positions shown · non-contrast
Comparison: Multiple prior most recent 12/22/2018

CLINICAL DATA: 85-year-old female with a history of respiratory
failure.

EXAM:
PORTABLE CHEST 1 VIEW

[chest ap]
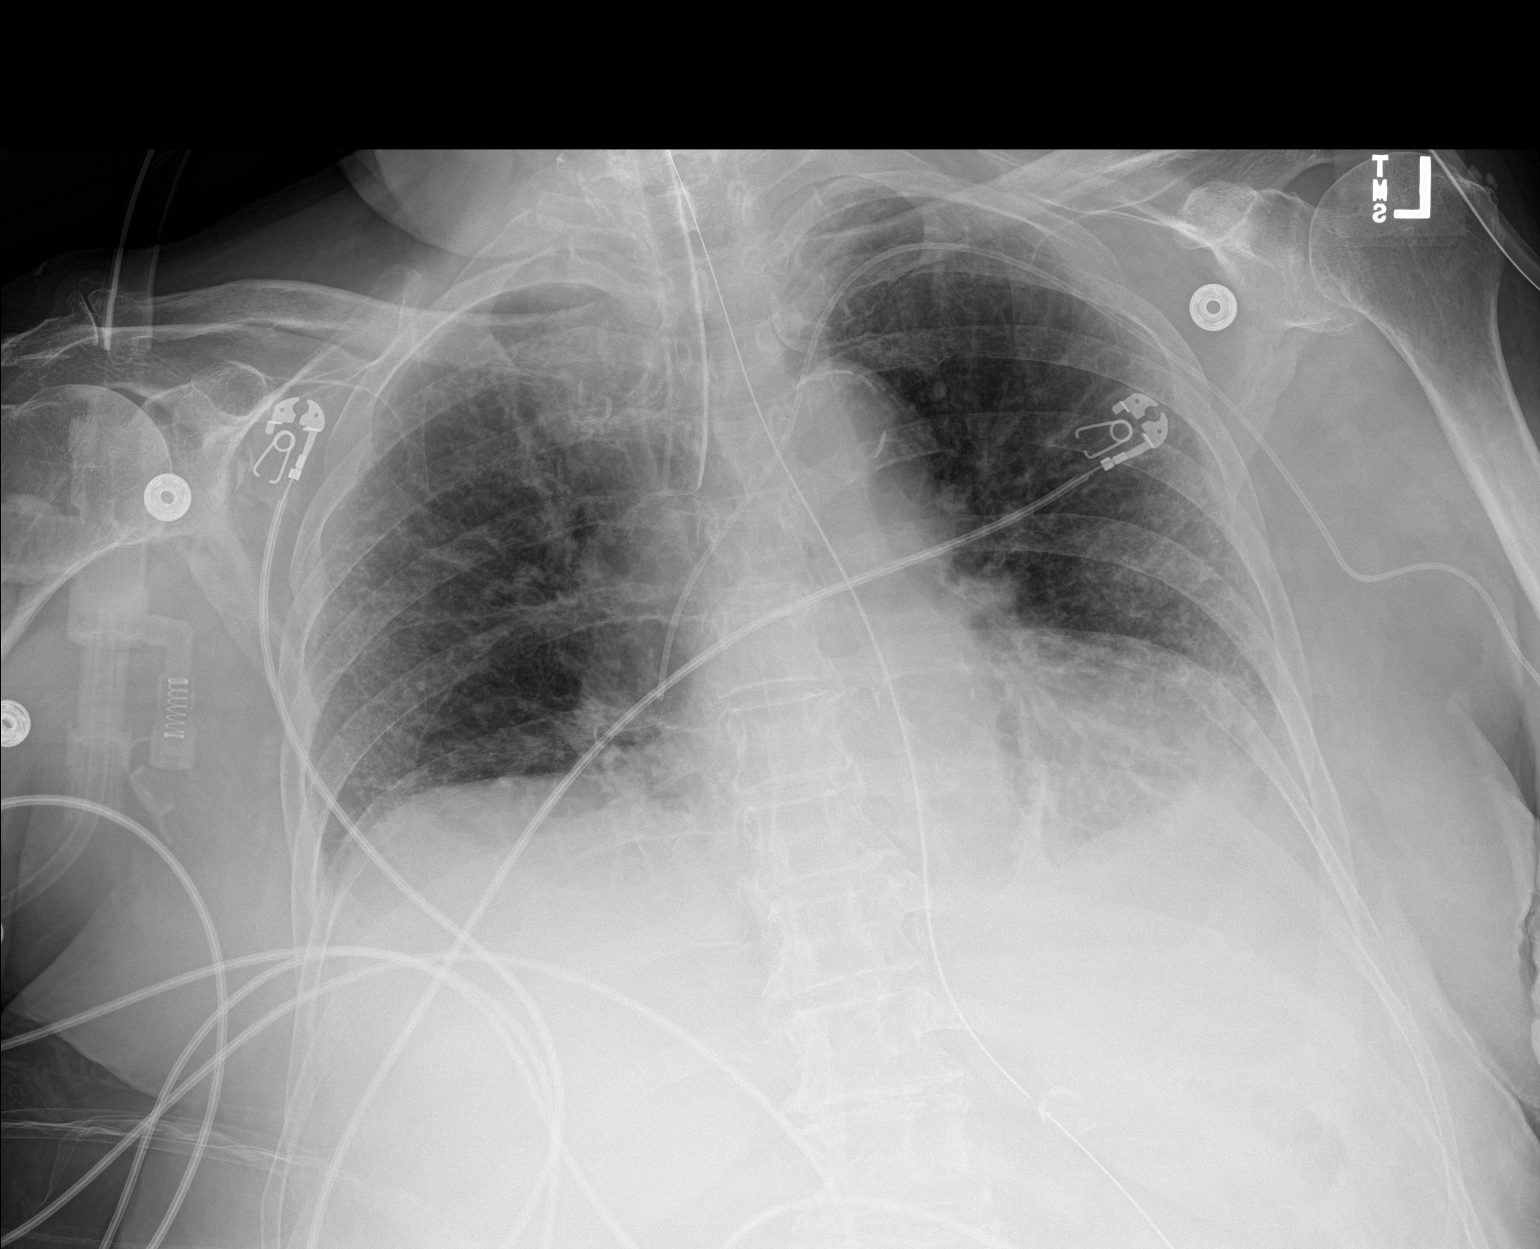

[1 of 1 positions shown; findings below may reference images not displayed]

FINDINGS: Cardiomediastinal silhouette unchanged in size and contour,
partially obscured by overlying lung/pleural disease and the low
lung volumes.

Blunting at the left costophrenic angle with opacity in the
retrocardiac region. Blunting of the right costophrenic angle.

Endotracheal tube terminates above the carina approximately 2.6 cm.

Left upper extremity PICC appears to terminate superior vena cava.

Gastric tube terminates out of the field of view.

Low lung volumes with coarsened interstitial markings. Aeration is
slightly improved from the prior. No pneumothorax.
IMPRESSION: Low lung volumes persist with small basilar pleural effusion and
associated atelectasis/consolidation.

Slight improved aeration of the lungs compared to prior.

Unchanged endotracheal tube, gastric tube, and left upper extremity
PICC

## 2020-02-05 IMAGING — DX PORTABLE ABDOMEN - 1 VIEW
1 series · 1 of 1 positions shown · non-contrast
Comparison: 12/25/2018

CLINICAL DATA: Nasogastric advancement.

EXAM:
PORTABLE ABDOMEN - 1 VIEW

[abdomen kub]
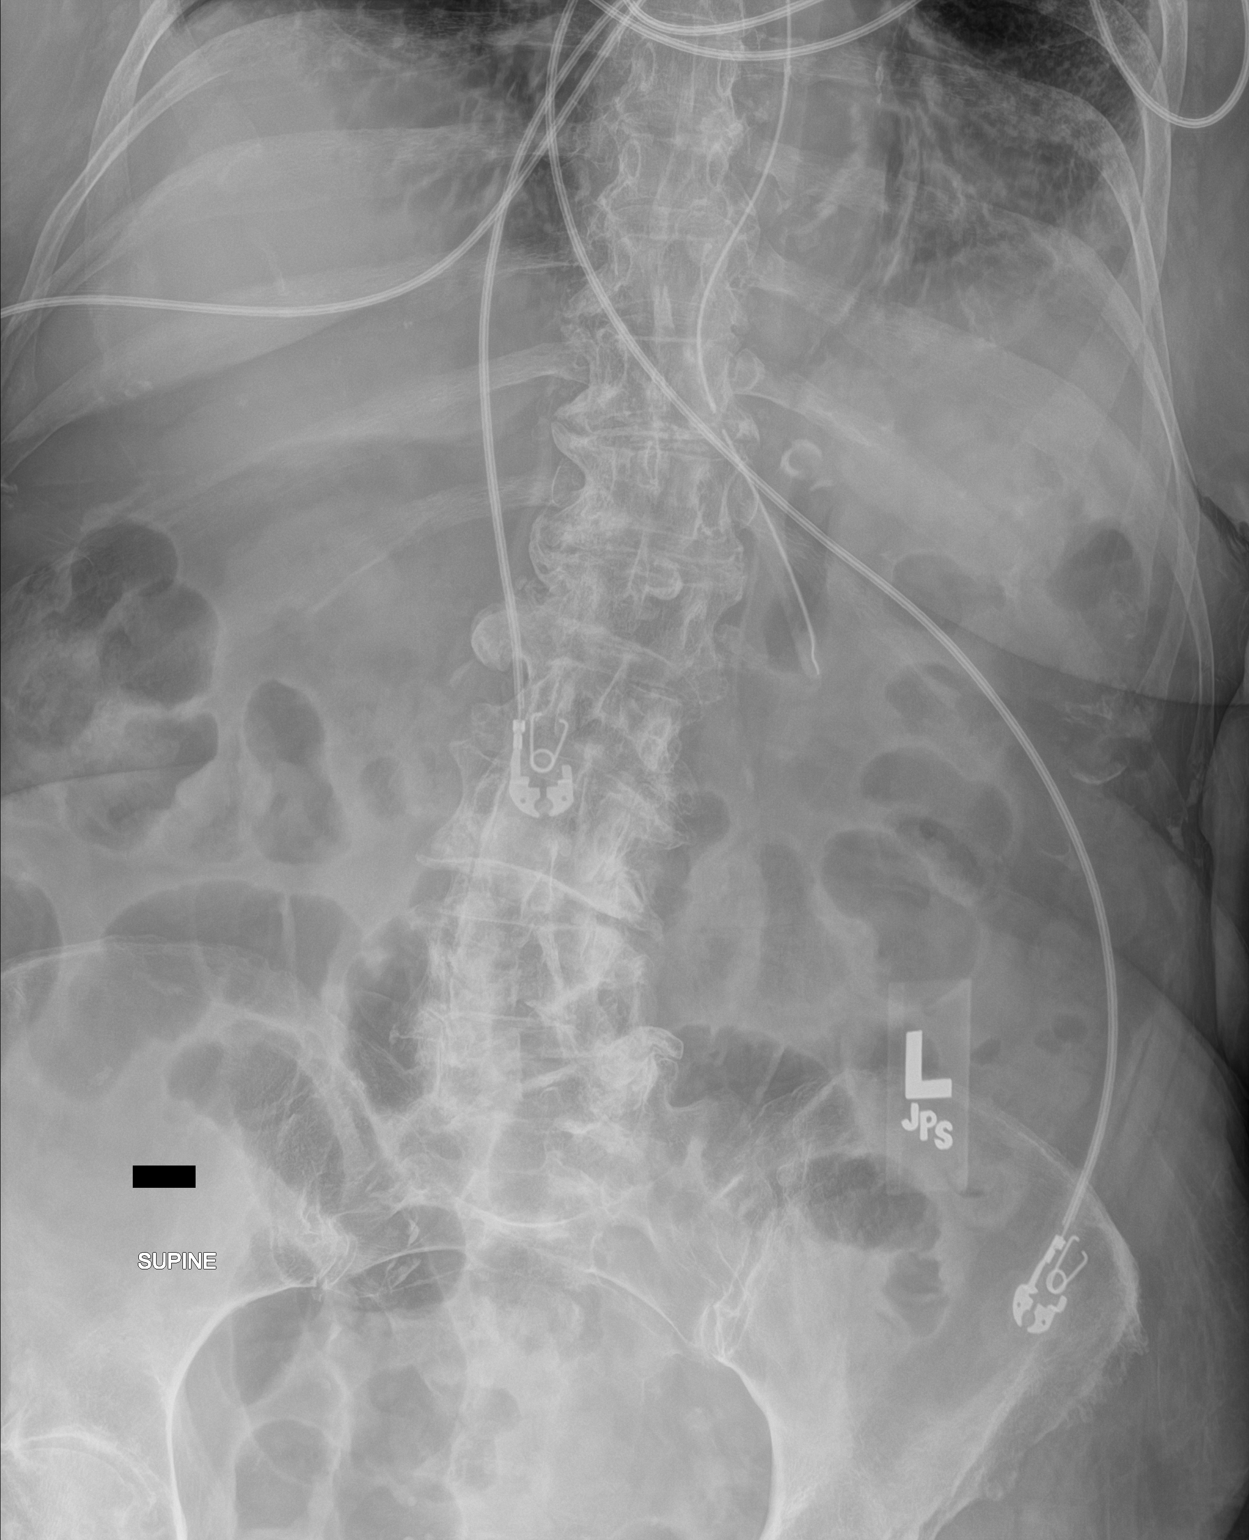

[1 of 1 positions shown; findings below may reference images not displayed]

FINDINGS: Nasogastric tube tip is advanced slightly, with the tip in the mid
body of the stomach. Side hole is probably at the gastroesophageal
junction or just within the stomach. Abdominal gas pattern is
unremarkable.
IMPRESSION: Nasogastric tube advanced slightly with the tip in the mid body.

## 2020-02-06 IMAGING — DX ABDOMEN - 2 VIEW
2 series · 2 of 2 positions shown · non-contrast
Comparison: 12/26/2018

CLINICAL DATA: NG tube placement

EXAM:
ABDOMEN - 2 VIEW

[abdomen erect]
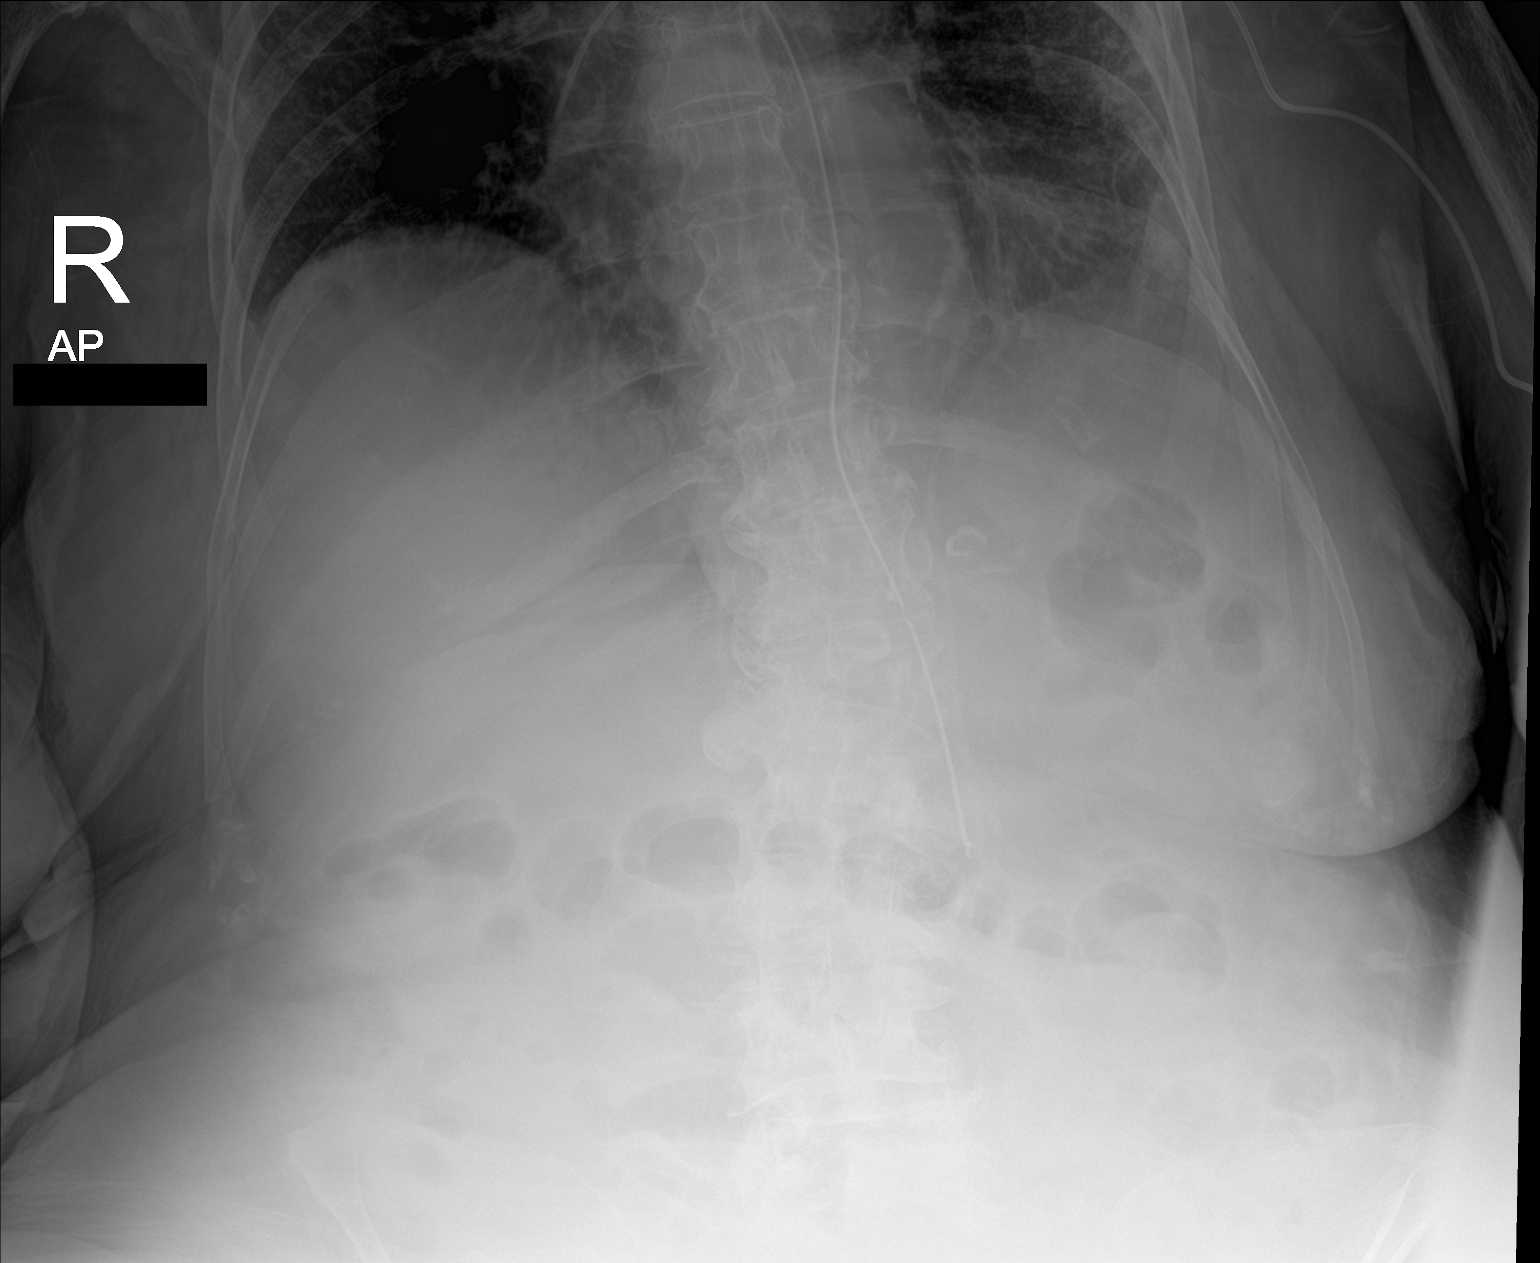

[abdomen supine]
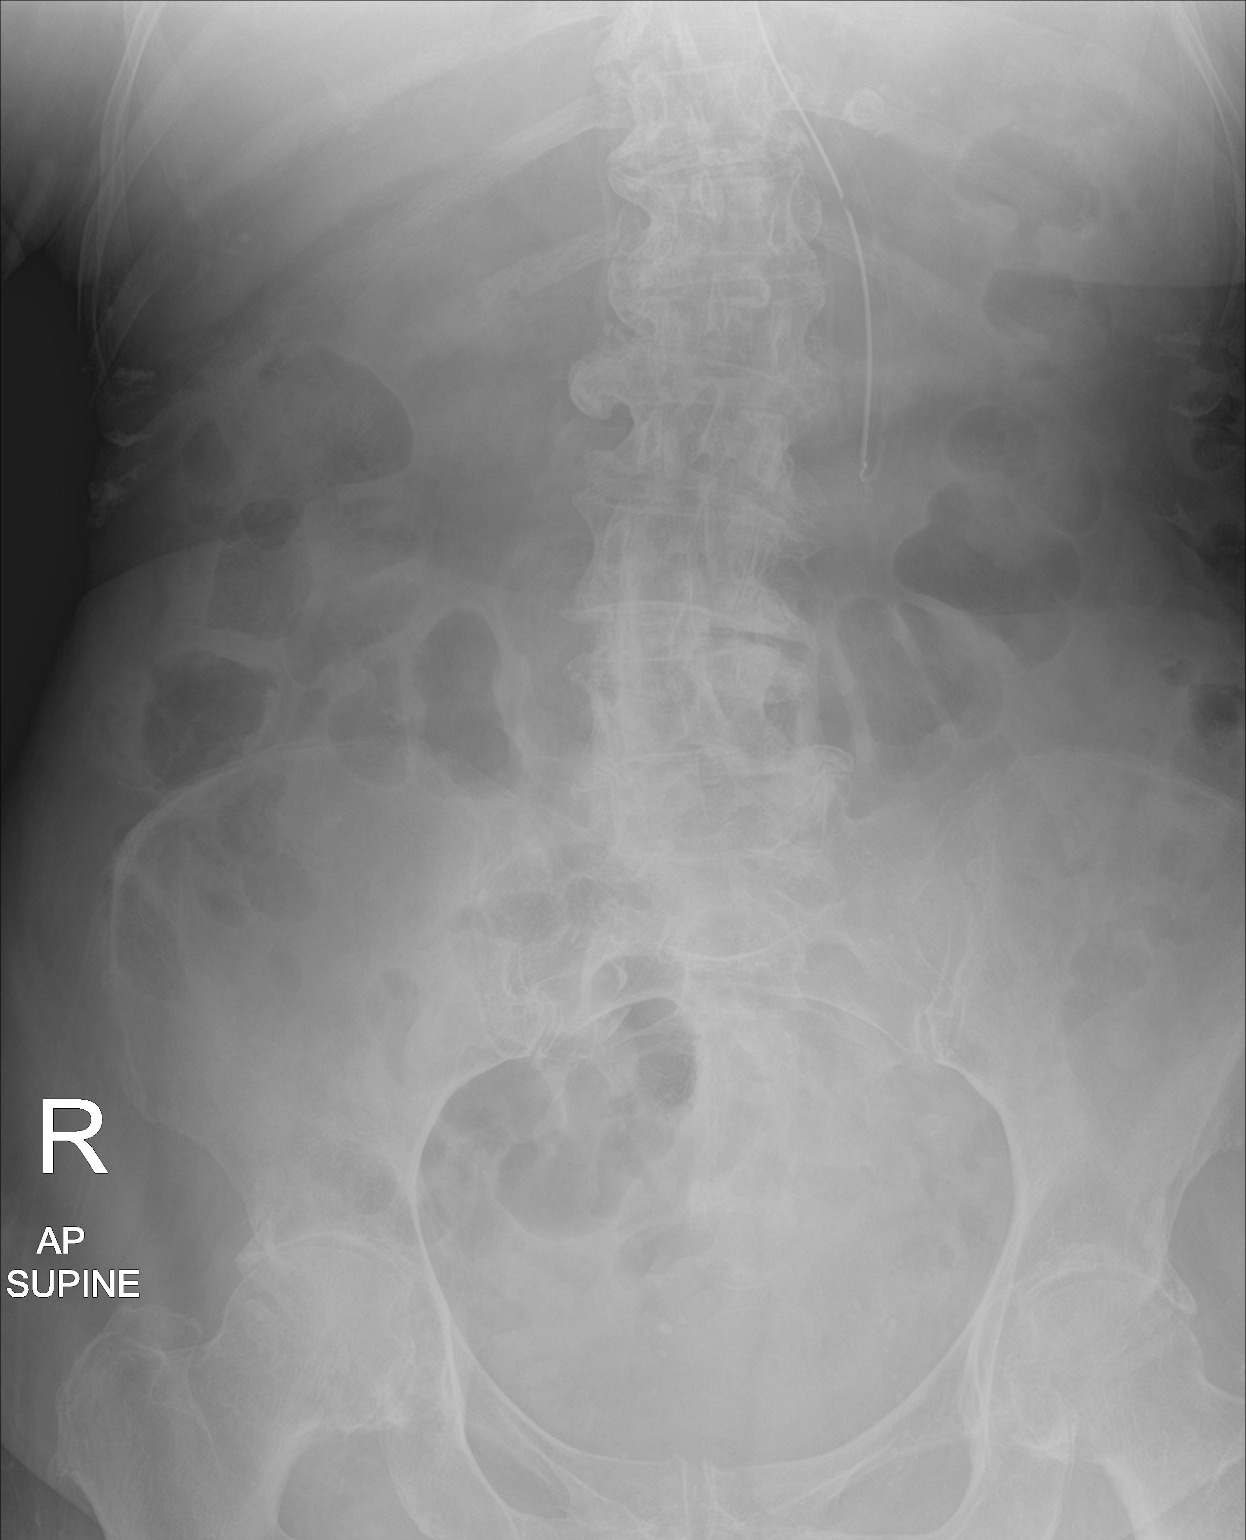

[2 of 2 positions shown; findings below may reference images not displayed]

FINDINGS: NG tube tip is in the body of the stomach. The stomach is
decompressed. There are no disproportionally dilated loops of bowel.
There is no free intraperitoneal gas. Vascular calcifications are
noted in the left upper quadrant and pelvis. Opacities at the left
lung base are unchanged.
IMPRESSION: Stable NG tube with its tip in the body of the stomach.
Nonobstructive bowel gas pattern.
